# Patient Record
Sex: Female | Born: 1955 | Race: Black or African American | Hispanic: No | Marital: Married | Smoking: Former smoker
Health system: Southern US, Community
[De-identification: ages and names within clinical notes are randomized; demographics above are authoritative.]

## PROBLEM LIST (undated history)

## (undated) DIAGNOSIS — I10 Essential (primary) hypertension: Secondary | ICD-10-CM

## (undated) DIAGNOSIS — F419 Anxiety disorder, unspecified: Secondary | ICD-10-CM

## (undated) DIAGNOSIS — R011 Cardiac murmur, unspecified: Secondary | ICD-10-CM

## (undated) DIAGNOSIS — F32A Depression, unspecified: Secondary | ICD-10-CM

## (undated) DIAGNOSIS — K649 Unspecified hemorrhoids: Secondary | ICD-10-CM

## (undated) DIAGNOSIS — E785 Hyperlipidemia, unspecified: Secondary | ICD-10-CM

## (undated) DIAGNOSIS — N952 Postmenopausal atrophic vaginitis: Secondary | ICD-10-CM

## (undated) DIAGNOSIS — F329 Major depressive disorder, single episode, unspecified: Secondary | ICD-10-CM

## (undated) DIAGNOSIS — R569 Unspecified convulsions: Secondary | ICD-10-CM

## (undated) DIAGNOSIS — T7840XA Allergy, unspecified, initial encounter: Secondary | ICD-10-CM

## (undated) DIAGNOSIS — R51 Headache: Secondary | ICD-10-CM

## (undated) DIAGNOSIS — G709 Myoneural disorder, unspecified: Secondary | ICD-10-CM

## (undated) HISTORY — DX: Allergy, unspecified, initial encounter: T78.40XA

## (undated) HISTORY — DX: Essential (primary) hypertension: I10

## (undated) HISTORY — DX: Cardiac murmur, unspecified: R01.1

## (undated) HISTORY — PX: FINGER ARTHROSCOPY: SHX5001

## (undated) HISTORY — DX: Postmenopausal atrophic vaginitis: N95.2

## (undated) HISTORY — DX: Unspecified convulsions: R56.9

## (undated) HISTORY — PX: TUBAL LIGATION: SHX77

## (undated) HISTORY — DX: Unspecified hemorrhoids: K64.9

---

## 1998-04-12 ENCOUNTER — Ambulatory Visit (HOSPITAL_COMMUNITY): Admission: RE | Admit: 1998-04-12 | Discharge: 1998-04-12 | Payer: Self-pay | Admitting: Family Medicine

## 1999-09-15 ENCOUNTER — Ambulatory Visit (HOSPITAL_COMMUNITY): Admission: RE | Admit: 1999-09-15 | Discharge: 1999-09-15 | Payer: Self-pay | Admitting: Hematology and Oncology

## 1999-09-15 ENCOUNTER — Encounter: Admission: RE | Admit: 1999-09-15 | Discharge: 1999-09-15 | Payer: Self-pay | Admitting: Internal Medicine

## 1999-09-18 ENCOUNTER — Encounter: Admission: RE | Admit: 1999-09-18 | Discharge: 1999-09-18 | Payer: Self-pay | Admitting: Internal Medicine

## 1999-10-19 ENCOUNTER — Ambulatory Visit (HOSPITAL_COMMUNITY): Admission: RE | Admit: 1999-10-19 | Discharge: 1999-10-19 | Payer: Self-pay | Admitting: *Deleted

## 1999-10-27 ENCOUNTER — Encounter: Admission: RE | Admit: 1999-10-27 | Discharge: 1999-10-27 | Payer: Self-pay | Admitting: Internal Medicine

## 2000-01-05 ENCOUNTER — Encounter: Admission: RE | Admit: 2000-01-05 | Discharge: 2000-01-05 | Payer: Self-pay | Admitting: Internal Medicine

## 2000-06-10 ENCOUNTER — Encounter: Payer: Self-pay | Admitting: Emergency Medicine

## 2000-06-10 ENCOUNTER — Emergency Department (HOSPITAL_COMMUNITY): Admission: EM | Admit: 2000-06-10 | Discharge: 2000-06-10 | Payer: Self-pay | Admitting: Emergency Medicine

## 2001-01-10 ENCOUNTER — Encounter: Payer: Self-pay | Admitting: Emergency Medicine

## 2001-01-10 ENCOUNTER — Emergency Department (HOSPITAL_COMMUNITY): Admission: EM | Admit: 2001-01-10 | Discharge: 2001-01-10 | Payer: Self-pay | Admitting: Emergency Medicine

## 2001-08-18 ENCOUNTER — Emergency Department (HOSPITAL_COMMUNITY): Admission: EM | Admit: 2001-08-18 | Discharge: 2001-08-18 | Payer: Self-pay | Admitting: Emergency Medicine

## 2001-08-18 ENCOUNTER — Encounter: Payer: Self-pay | Admitting: Emergency Medicine

## 2003-03-07 ENCOUNTER — Encounter: Payer: Self-pay | Admitting: *Deleted

## 2003-03-07 ENCOUNTER — Emergency Department (HOSPITAL_COMMUNITY): Admission: EM | Admit: 2003-03-07 | Discharge: 2003-03-07 | Payer: Self-pay | Admitting: *Deleted

## 2003-10-18 ENCOUNTER — Emergency Department (HOSPITAL_COMMUNITY): Admission: EM | Admit: 2003-10-18 | Discharge: 2003-10-18 | Payer: Self-pay | Admitting: Emergency Medicine

## 2005-03-30 ENCOUNTER — Encounter (HOSPITAL_COMMUNITY): Admission: RE | Admit: 2005-03-30 | Discharge: 2005-06-27 | Payer: Self-pay | Admitting: Internal Medicine

## 2005-04-17 ENCOUNTER — Ambulatory Visit: Payer: Self-pay | Admitting: Gastroenterology

## 2005-05-07 ENCOUNTER — Ambulatory Visit: Payer: Self-pay | Admitting: Gastroenterology

## 2005-05-07 HISTORY — PX: COLONOSCOPY: SHX174

## 2005-06-14 ENCOUNTER — Emergency Department (HOSPITAL_COMMUNITY): Admission: EM | Admit: 2005-06-14 | Discharge: 2005-06-14 | Payer: Self-pay | Admitting: *Deleted

## 2007-02-05 ENCOUNTER — Ambulatory Visit (HOSPITAL_COMMUNITY): Admission: RE | Admit: 2007-02-05 | Discharge: 2007-02-05 | Payer: Self-pay | Admitting: Internal Medicine

## 2007-06-27 ENCOUNTER — Emergency Department (HOSPITAL_COMMUNITY): Admission: EM | Admit: 2007-06-27 | Discharge: 2007-06-27 | Payer: Self-pay | Admitting: Emergency Medicine

## 2008-01-05 ENCOUNTER — Emergency Department (HOSPITAL_COMMUNITY): Admission: EM | Admit: 2008-01-05 | Discharge: 2008-01-05 | Payer: Self-pay | Admitting: Emergency Medicine

## 2008-02-10 ENCOUNTER — Ambulatory Visit (HOSPITAL_COMMUNITY): Admission: RE | Admit: 2008-02-10 | Discharge: 2008-02-10 | Payer: Self-pay | Admitting: Internal Medicine

## 2008-05-14 ENCOUNTER — Encounter: Admission: RE | Admit: 2008-05-14 | Discharge: 2008-05-14 | Payer: Self-pay | Admitting: Internal Medicine

## 2009-06-01 ENCOUNTER — Ambulatory Visit (HOSPITAL_COMMUNITY): Admission: RE | Admit: 2009-06-01 | Discharge: 2009-06-01 | Payer: Self-pay | Admitting: Internal Medicine

## 2010-10-17 ENCOUNTER — Ambulatory Visit (HOSPITAL_COMMUNITY)
Admission: RE | Admit: 2010-10-17 | Discharge: 2010-10-17 | Payer: Self-pay | Source: Home / Self Care | Attending: Internal Medicine | Admitting: Internal Medicine

## 2010-10-21 ENCOUNTER — Encounter: Payer: Self-pay | Admitting: Internal Medicine

## 2011-05-25 ENCOUNTER — Emergency Department (HOSPITAL_COMMUNITY): Payer: BC Managed Care – PPO

## 2011-05-25 ENCOUNTER — Inpatient Hospital Stay (INDEPENDENT_AMBULATORY_CARE_PROVIDER_SITE_OTHER)
Admission: RE | Admit: 2011-05-25 | Discharge: 2011-05-25 | Disposition: A | Discharge status: Home / Self Care | Payer: BC Managed Care – PPO | Source: Ambulatory Visit | Attending: Emergency Medicine | Admitting: Emergency Medicine

## 2011-05-25 ENCOUNTER — Emergency Department (HOSPITAL_COMMUNITY)
Admission: EM | Admit: 2011-05-25 | Discharge: 2011-05-25 | Disposition: A | Discharge status: Home / Self Care | Payer: BC Managed Care – PPO | Attending: Emergency Medicine | Admitting: Emergency Medicine

## 2011-05-25 DIAGNOSIS — R0789 Other chest pain: Secondary | ICD-10-CM | POA: Insufficient documentation

## 2011-05-25 DIAGNOSIS — E119 Type 2 diabetes mellitus without complications: Secondary | ICD-10-CM | POA: Insufficient documentation

## 2011-05-25 DIAGNOSIS — I10 Essential (primary) hypertension: Secondary | ICD-10-CM | POA: Insufficient documentation

## 2011-05-25 DIAGNOSIS — R51 Headache: Secondary | ICD-10-CM | POA: Insufficient documentation

## 2011-05-25 DIAGNOSIS — R569 Unspecified convulsions: Secondary | ICD-10-CM | POA: Insufficient documentation

## 2011-05-25 DIAGNOSIS — R079 Chest pain, unspecified: Secondary | ICD-10-CM

## 2011-05-25 LAB — POCT I-STAT, CHEM 8
BUN: 15 mg/dL (ref 6–23)
Chloride: 103 mEq/L (ref 96–112)
Sodium: 142 mEq/L (ref 135–145)

## 2011-05-25 LAB — CK TOTAL AND CKMB (NOT AT ARMC)
CK, MB: 4.8 ng/mL — ABNORMAL HIGH (ref 0.3–4.0)
Relative Index: 2 (ref 0.0–2.5)

## 2011-05-25 LAB — CBC
HCT: 39.8 % (ref 36.0–46.0)
Hemoglobin: 13.9 g/dL (ref 12.0–15.0)
MCV: 78.3 fL (ref 78.0–100.0)
RBC: 5.08 MIL/uL (ref 3.87–5.11)
WBC: 8.4 10*3/uL (ref 4.0–10.5)

## 2011-05-25 LAB — BASIC METABOLIC PANEL
CO2: 29 mEq/L (ref 19–32)
GFR calc non Af Amer: >60 mL/min (ref >60)
Glucose, Bld: 114 mg/dL — ABNORMAL HIGH (ref 70–99)
Potassium: 3.9 mEq/L (ref 3.5–5.1)
Sodium: 138 mEq/L (ref 135–145)

## 2011-05-25 LAB — URINALYSIS, ROUTINE W REFLEX MICROSCOPIC
Bilirubin Urine: NEGATIVE
Nitrite: NEGATIVE
Specific Gravity, Urine: 1.009 (ref 1.005–1.030)
Urobilinogen, UA: 0.2 mg/dL (ref 0.0–1.0)

## 2011-05-25 LAB — DIFFERENTIAL
Lymphocytes Relative: 29 % (ref 12–46)
Lymphs Abs: 2.5 10*3/uL (ref 0.7–4.0)
Neutrophils Relative %: 58 % (ref 43–77)

## 2011-05-25 LAB — GLUCOSE, CAPILLARY: Glucose-Capillary: 103 mg/dL — ABNORMAL HIGH (ref 70–99)

## 2011-05-25 LAB — URINE MICROSCOPIC-ADD ON

## 2011-10-29 ENCOUNTER — Other Ambulatory Visit (HOSPITAL_COMMUNITY): Payer: Self-pay | Admitting: Internal Medicine

## 2011-10-29 DIAGNOSIS — Z1231 Encounter for screening mammogram for malignant neoplasm of breast: Secondary | ICD-10-CM

## 2011-11-27 ENCOUNTER — Ambulatory Visit (HOSPITAL_COMMUNITY)
Admission: RE | Admit: 2011-11-27 | Discharge: 2011-11-27 | Disposition: A | Discharge status: Home / Self Care | Payer: BC Managed Care – PPO | Source: Ambulatory Visit | Attending: Internal Medicine | Admitting: Internal Medicine

## 2011-11-27 DIAGNOSIS — Z1231 Encounter for screening mammogram for malignant neoplasm of breast: Secondary | ICD-10-CM | POA: Insufficient documentation

## 2011-12-20 ENCOUNTER — Encounter (INDEPENDENT_AMBULATORY_CARE_PROVIDER_SITE_OTHER): Payer: Self-pay | Admitting: Surgery

## 2011-12-25 ENCOUNTER — Encounter (INDEPENDENT_AMBULATORY_CARE_PROVIDER_SITE_OTHER): Payer: Self-pay | Admitting: Surgery

## 2011-12-26 ENCOUNTER — Encounter (INDEPENDENT_AMBULATORY_CARE_PROVIDER_SITE_OTHER): Payer: Self-pay | Admitting: Surgery

## 2012-01-09 ENCOUNTER — Ambulatory Visit (INDEPENDENT_AMBULATORY_CARE_PROVIDER_SITE_OTHER): Payer: Self-pay | Admitting: Surgery

## 2012-08-06 ENCOUNTER — Encounter (INDEPENDENT_AMBULATORY_CARE_PROVIDER_SITE_OTHER): Payer: Self-pay | Admitting: Surgery

## 2012-08-25 ENCOUNTER — Ambulatory Visit (INDEPENDENT_AMBULATORY_CARE_PROVIDER_SITE_OTHER): Payer: BC Managed Care – PPO | Admitting: Surgery

## 2012-08-25 ENCOUNTER — Encounter (INDEPENDENT_AMBULATORY_CARE_PROVIDER_SITE_OTHER): Payer: Self-pay | Admitting: Surgery

## 2012-08-25 VITALS — BP 146/82 | HR 64 | Temp 97.1°F | Resp 20 | Ht 62.0 in | Wt 170.6 lb

## 2012-08-25 DIAGNOSIS — K644 Residual hemorrhoidal skin tags: Secondary | ICD-10-CM | POA: Insufficient documentation

## 2012-08-25 NOTE — Progress Notes (Signed)
Patient ID: Heidi Lewis, female   DOB: 13-Mar-1956, 56 y.o.   MRN: [id number]  Chief Complaint  Patient presents with  . New Evaluation    eval internal hems    HPI Heidi Lewis is a 56 y.o. female.   HPI  This is a very pleasant lady referred by Dr.Avbuere for evaluation of bleeding hemorrhoids. She has been having bouts from a 2 year period she's been treated with medications including steroid suppositories without improvement. She has minimal pain. She has no issues with constipation or issues with continence. She has had no previous surgery on her hemorrhoids. She is otherwise without complaints Past Medical History  Diagnosis Date  . Diabetes mellitus   . Hemorrhoid   . Hypertension   . Vaginitis, atrophic   . Heart murmur     Past Surgical History  Procedure Date  . Cesarean section     x3    Family History  Problem Relation Age of Onset  . Heart disease Mother   . Cancer Father     Social History History  Substance Use Topics  . Smoking status: Former Smoker    Quit date: 12/25/1983  . Smokeless tobacco: Not on file  . Alcohol Use: No    Allergies  Allergen Reactions  . Codeine Nausea Only    Current Outpatient Prescriptions  Medication Sig Dispense Refill  . losartan (COZAAR) 50 MG tablet Take 50 mg by mouth daily.      . metFORMIN (GLUCOPHAGE) 500 MG tablet Take 500 mg by mouth daily with breakfast.        Review of Systems Review of Systems  Constitutional: Negative for fever, chills and unexpected weight change.  HENT: Negative for hearing loss, congestion, sore throat, trouble swallowing and voice change.   Eyes: Negative for visual disturbance.  Respiratory: Negative for cough and wheezing.   Cardiovascular: Negative for chest pain, palpitations and leg swelling.  Gastrointestinal: Positive for anal bleeding. Negative for nausea, vomiting, abdominal pain, diarrhea, constipation, blood in stool and abdominal distention.  Genitourinary: Negative  for hematuria, vaginal bleeding and difficulty urinating.  Musculoskeletal: Negative for arthralgias.  Skin: Negative for rash and wound.  Neurological: Negative for seizures, syncope and headaches.  Hematological: Negative for adenopathy. Does not bruise/bleed easily.  Psychiatric/Behavioral: Negative for confusion.    Blood pressure 146/82, pulse 64, temperature 97.1 F (36.2 C), temperature source Temporal, resp. rate 20, height 5\' 2"  (1.575 m), weight 170 lb 9.6 oz (77.384 kg).  Physical Exam Physical Exam  Constitutional: She is oriented to person, place, and time. She appears well-developed and well-nourished. No distress.  HENT:  Head: Normocephalic and atraumatic.  Right Ear: External ear normal.  Left Ear: External ear normal.  Nose: Nose normal.  Mouth/Throat: Oropharynx is clear and moist. No oropharyngeal exudate.  Eyes: Conjunctivae normal are normal. Pupils are equal, round, and reactive to light. Right eye exhibits no discharge. Left eye exhibits no discharge. No scleral icterus.  Neck: Normal range of motion. Neck supple. No tracheal deviation present. No thyromegaly present.  Cardiovascular: Normal rate, regular rhythm, normal heart sounds and intact distal pulses.   No murmur heard. Pulmonary/Chest: Effort normal and breath sounds normal. No respiratory distress. She has no wheezes. She has no rales.  Abdominal: Soft. Bowel sounds are normal. She exhibits no distension. There is no tenderness. There is no rebound.  Genitourinary:       She has large external hemorrhoids including one that is excoriated and bleeds easily.  It is soft.  Musculoskeletal: Normal range of motion. She exhibits no edema and no tenderness.  Lymphadenopathy:    She has no cervical adenopathy.  Neurological: She is alert and oriented to person, place, and time.  Skin: Skin is warm and dry. No rash noted. She is not diaphoretic. No erythema.  Psychiatric: Her behavior is normal. Judgment  normal.    Data Reviewed   Assessment    Bleeding external hemorrhoid    Plan    Exam under anesthesia and excision is recommended. I believe pathologic evaluation is needed for this large hemorrhoid to rule out malignancy as it is quite friable and excoriated. I discussed this with her in detail. I discussed the risks of surgery which includes but is not limited to bleeding, infection, incontinence, recurrence, need for further surgery, et Karie Soda. She understands and wishes to proceed. Likelihood of success is good       [name] A 08/25/2012, 4:52 PM

## 2012-09-10 ENCOUNTER — Encounter (HOSPITAL_COMMUNITY): Payer: Self-pay

## 2012-09-10 ENCOUNTER — Encounter (HOSPITAL_COMMUNITY)
Admission: RE | Admit: 2012-09-10 | Discharge: 2012-09-10 | Disposition: A | Discharge status: Home / Self Care | Payer: BC Managed Care – PPO | Source: Ambulatory Visit | Attending: Anesthesiology | Admitting: Anesthesiology

## 2012-09-10 ENCOUNTER — Encounter (HOSPITAL_COMMUNITY)
Admission: RE | Admit: 2012-09-10 | Discharge: 2012-09-10 | Disposition: A | Discharge status: Home / Self Care | Payer: BC Managed Care – PPO | Source: Ambulatory Visit | Attending: Surgery | Admitting: Surgery

## 2012-09-10 HISTORY — DX: Headache: R51

## 2012-09-10 LAB — CBC
MCH: 25.8 pg — ABNORMAL LOW (ref 26.0–34.0)
MCHC: 32.7 g/dL (ref 30.0–36.0)
Platelets: 318 10*3/uL (ref 150–400)
RBC: 4.88 MIL/uL (ref 3.87–5.11)

## 2012-09-10 LAB — BASIC METABOLIC PANEL
Calcium: 11.2 mg/dL — ABNORMAL HIGH (ref 8.4–10.5)
GFR calc non Af Amer: >90 mL/min (ref >90)
Glucose, Bld: 108 mg/dL — ABNORMAL HIGH (ref 70–99)
Sodium: 144 mEq/L (ref 135–145)

## 2012-09-10 LAB — SURGICAL PCR SCREEN: MRSA, PCR: NEGATIVE

## 2012-09-10 NOTE — Pre-Procedure Instructions (Signed)
20 BLASA RAISCH  09/10/2012   Your procedure is scheduled on:  Friday, December 20th  Report to [facility] at 0530 AM.  Call this number if you have problems the morning of surgery: [phone]   Remember:   Do not eat food or drink:After Midnight.   Take these medicines the morning of surgery with A SIP OF WATER: none   Do not wear jewelry, make-up or nail polish.  Do not wear lotions, powders, or perfumes  Do not shave 48 hours prior to surgery. Men may shave face and neck.  Do not bring valuables to the hospital.  Contacts, dentures or bridgework may not be worn into surgery.  Leave suitcase in the car. After surgery it may be brought to your room.  For patients admitted to the hospital, checkout time is 11:00 AM the day of discharge.   Patients discharged the day of surgery will not be allowed to drive home.    Special Instructions: Shower using CHG 2 nights before surgery and the night before surgery.  If you shower the day of surgery use CHG.  Use special wash - you have one bottle of CHG for all showers.  You should use approximately 1/3 of the bottle for each shower.   Please read over the following fact sheets that you were given: Pain Booklet, Coughing and Deep Breathing and Surgical Site Infection Prevention

## 2012-09-12 ENCOUNTER — Other Ambulatory Visit (HOSPITAL_COMMUNITY): Payer: BC Managed Care – PPO

## 2012-09-18 MED ORDER — CEFAZOLIN SODIUM-DEXTROSE 2-3 GM-% IV SOLR
2.0000 g | INTRAVENOUS | Status: AC
  Administered 2012-09-19: 2 g via INTRAVENOUS
  Filled 2012-09-18 (×2): qty 50

## 2012-09-18 NOTE — H&P (Signed)
Patient ID: Heidi Lewis, female DOB: 1956/02/17, 56 y.o. MRN: [id number]  Chief Complaint   Patient presents with   .  New Evaluation     eval internal hems    HPI  Heidi Lewis is a 56 y.o. female.  HPI  This is a very pleasant lady referred by Dr.Avbuere for evaluation of bleeding hemorrhoids. She has been having bouts from a 2 year period she's been treated with medications including steroid suppositories without improvement. She has minimal pain. She has no issues with constipation or issues with continence. She has had no previous surgery on her hemorrhoids. She is otherwise without complaints  Past Medical History   Diagnosis  Date   .  Diabetes mellitus    .  Hemorrhoid    .  Hypertension    .  Vaginitis, atrophic    .  Heart murmur     Past Surgical History   Procedure  Date   .  Cesarean section      x3    Family History   Problem  Relation  Age of Onset   .  Heart disease  Mother    .  Cancer  Father     Social History  History   Substance Use Topics   .  Smoking status:  Former Smoker     Quit date:  12/25/1983   .  Smokeless tobacco:  Not on file   .  Alcohol Use:  No    Allergies   Allergen  Reactions   .  Codeine  Nausea Only    Current Outpatient Prescriptions   Medication  Sig  Dispense  Refill   .  losartan (COZAAR) 50 MG tablet  Take 50 mg by mouth daily.     .  metFORMIN (GLUCOPHAGE) 500 MG tablet  Take 500 mg by mouth daily with breakfast.      Review of Systems  Review of Systems  Constitutional: Negative for fever, chills and unexpected weight change.  HENT: Negative for hearing loss, congestion, sore throat, trouble swallowing and voice change.  Eyes: Negative for visual disturbance.  Respiratory: Negative for cough and wheezing.  Cardiovascular: Negative for chest pain, palpitations and leg swelling.  Gastrointestinal: Positive for anal bleeding. Negative for nausea, vomiting, abdominal pain, diarrhea, constipation, blood in stool and  abdominal distention.  Genitourinary: Negative for hematuria, vaginal bleeding and difficulty urinating.  Musculoskeletal: Negative for arthralgias.  Skin: Negative for rash and wound.  Neurological: Negative for seizures, syncope and headaches.  Hematological: Negative for adenopathy. Does not bruise/bleed easily.  Psychiatric/Behavioral: Negative for confusion.   Blood pressure 146/82, pulse 64, temperature 97.1 F (36.2 C), temperature source Temporal, resp. rate 20, height 5\' 2"  (1.575 m), weight 170 lb 9.6 oz (77.384 kg).  Physical Exam  Physical Exam  Constitutional: She is oriented to person, place, and time. She appears well-developed and well-nourished. No distress.  HENT:  Head: Normocephalic and atraumatic.  Right Ear: External ear normal.  Left Ear: External ear normal.  Nose: Nose normal.  Mouth/Throat: Oropharynx is clear and moist. No oropharyngeal exudate.  Eyes: Conjunctivae normal are normal. Pupils are equal, round, and reactive to light. Right eye exhibits no discharge. Left eye exhibits no discharge. No scleral icterus.  Neck: Normal range of motion. Neck supple. No tracheal deviation present. No thyromegaly present.  Cardiovascular: Normal rate, regular rhythm, normal heart sounds and intact distal pulses.  No murmur heard.  Pulmonary/Chest: Effort normal and breath sounds normal.  No respiratory distress. She has no wheezes. She has no rales.  Abdominal: Soft. Bowel sounds are normal. She exhibits no distension. There is no tenderness. There is no rebound.  Genitourinary:  She has large external hemorrhoids including one that is excoriated and bleeds easily. It is soft.  Musculoskeletal: Normal range of motion. She exhibits no edema and no tenderness.  Lymphadenopathy:  She has no cervical adenopathy.  Neurological: She is alert and oriented to person, place, and time.  Skin: Skin is warm and dry. No rash noted. She is not diaphoretic. No erythema.  Psychiatric:  Her behavior is normal. Judgment normal.   Data Reviewed  Assessment   Bleeding external hemorrhoid   Plan   Exam under anesthesia and excision is recommended. I believe pathologic evaluation is needed for this large hemorrhoid to rule out malignancy as it is quite friable and excoriated. I discussed this with her in detail. I discussed the risks of surgery which includes but is not limited to bleeding, infection, incontinence, recurrence, need for further surgery, et Karie Soda. She understands and wishes to proceed. Likelihood of success is good   Marlon Suleiman A

## 2012-09-19 ENCOUNTER — Encounter (HOSPITAL_COMMUNITY): Payer: Self-pay | Admitting: Surgery

## 2012-09-19 ENCOUNTER — Encounter (HOSPITAL_COMMUNITY): Payer: Self-pay | Admitting: Critical Care Medicine

## 2012-09-19 ENCOUNTER — Ambulatory Visit (HOSPITAL_COMMUNITY)
Admission: RE | Admit: 2012-09-19 | Discharge: 2012-09-19 | Disposition: A | Discharge status: Home / Self Care | Payer: BC Managed Care – PPO | Source: Ambulatory Visit | Attending: Surgery | Admitting: Surgery

## 2012-09-19 ENCOUNTER — Encounter (HOSPITAL_COMMUNITY)
Admission: RE | Disposition: A | Discharge status: Home / Self Care | Payer: Self-pay | Source: Ambulatory Visit | Attending: Surgery

## 2012-09-19 ENCOUNTER — Ambulatory Visit (HOSPITAL_COMMUNITY): Payer: BC Managed Care – PPO | Admitting: Critical Care Medicine

## 2012-09-19 DIAGNOSIS — I10 Essential (primary) hypertension: Secondary | ICD-10-CM | POA: Insufficient documentation

## 2012-09-19 DIAGNOSIS — R011 Cardiac murmur, unspecified: Secondary | ICD-10-CM | POA: Insufficient documentation

## 2012-09-19 DIAGNOSIS — K648 Other hemorrhoids: Secondary | ICD-10-CM | POA: Insufficient documentation

## 2012-09-19 DIAGNOSIS — Z885 Allergy status to narcotic agent status: Secondary | ICD-10-CM | POA: Insufficient documentation

## 2012-09-19 DIAGNOSIS — E119 Type 2 diabetes mellitus without complications: Secondary | ICD-10-CM | POA: Insufficient documentation

## 2012-09-19 DIAGNOSIS — Z8249 Family history of ischemic heart disease and other diseases of the circulatory system: Secondary | ICD-10-CM | POA: Insufficient documentation

## 2012-09-19 DIAGNOSIS — K644 Residual hemorrhoidal skin tags: Secondary | ICD-10-CM

## 2012-09-19 DIAGNOSIS — Z809 Family history of malignant neoplasm, unspecified: Secondary | ICD-10-CM | POA: Insufficient documentation

## 2012-09-19 HISTORY — PX: EVALUATION UNDER ANESTHESIA WITH HEMORRHOIDECTOMY: SHX5624

## 2012-09-19 LAB — GLUCOSE, CAPILLARY: Glucose-Capillary: 111 mg/dL — ABNORMAL HIGH (ref 70–99)

## 2012-09-19 SURGERY — EXAM UNDER ANESTHESIA WITH HEMORRHOIDECTOMY
Anesthesia: General | Site: Rectum | Wound class: Clean Contaminated

## 2012-09-19 MED ORDER — BUPIVACAINE LIPOSOME 1.3 % IJ SUSP
20.0000 mL | Freq: Once | INTRAMUSCULAR | Status: DC
  Filled 2012-09-19: qty 20

## 2012-09-19 MED ORDER — BUPIVACAINE-EPINEPHRINE (PF) 0.5% -1:200000 IJ SOLN
INTRAMUSCULAR | Status: AC
  Filled 2012-09-19: qty 10

## 2012-09-19 MED ORDER — BUPIVACAINE LIPOSOME 1.3 % IJ SUSP
INTRAMUSCULAR | Status: DC | PRN
  Administered 2012-09-19: 20 mL

## 2012-09-19 MED ORDER — 0.9 % SODIUM CHLORIDE (POUR BTL) OPTIME
TOPICAL | Status: DC | PRN
  Administered 2012-09-19: 1000 mL

## 2012-09-19 MED ORDER — HEMOSTATIC AGENTS (NO CHARGE) OPTIME
TOPICAL | Status: DC | PRN
  Administered 2012-09-19: 1 via TOPICAL

## 2012-09-19 MED ORDER — KETOROLAC TROMETHAMINE 30 MG/ML IJ SOLN
INTRAMUSCULAR | Status: DC | PRN
  Administered 2012-09-19: 30 mg via INTRAVENOUS

## 2012-09-19 MED ORDER — HYDROCODONE-ACETAMINOPHEN 5-325 MG PO TABS: 1.0000 | ORAL_TABLET | ORAL | Status: DC | PRN

## 2012-09-19 MED ORDER — FENTANYL CITRATE 0.05 MG/ML IJ SOLN: 25.0000 ug | INTRAMUSCULAR | Status: DC | PRN

## 2012-09-19 MED ORDER — ONDANSETRON HCL 4 MG/2ML IJ SOLN
INTRAMUSCULAR | Status: DC | PRN
  Administered 2012-09-19: 4 mg via INTRAVENOUS

## 2012-09-19 MED ORDER — LIDOCAINE HCL (CARDIAC) 20 MG/ML IV SOLN
INTRAVENOUS | Status: DC | PRN
  Administered 2012-09-19: 100 mg via INTRAVENOUS

## 2012-09-19 MED ORDER — DIBUCAINE 1 % RE OINT
TOPICAL_OINTMENT | RECTAL | Status: AC
  Filled 2012-09-19: qty 28

## 2012-09-19 MED ORDER — PROPOFOL 10 MG/ML IV BOLUS
INTRAVENOUS | Status: DC | PRN
  Administered 2012-09-19: 200 mg via INTRAVENOUS
  Administered 2012-09-19: 50 mg via INTRAVENOUS

## 2012-09-19 MED ORDER — LIDOCAINE 5 % EX OINT: TOPICAL_OINTMENT | CUTANEOUS | Status: DC | PRN

## 2012-09-19 MED ORDER — LACTATED RINGERS IV SOLN
INTRAVENOUS | Status: DC | PRN
  Administered 2012-09-19: 07:00:00 via INTRAVENOUS

## 2012-09-19 MED ORDER — FENTANYL CITRATE 0.05 MG/ML IJ SOLN
INTRAMUSCULAR | Status: DC | PRN
  Administered 2012-09-19 (×3): 25 ug via INTRAVENOUS

## 2012-09-19 MED ORDER — MIDAZOLAM HCL 5 MG/5ML IJ SOLN
INTRAMUSCULAR | Status: DC | PRN
  Administered 2012-09-19: 2 mg via INTRAVENOUS

## 2012-09-19 SURGICAL SUPPLY — 35 items
BLADE SURG 15 STRL LF DISP TIS (BLADE) ×1 IMPLANT
BLADE SURG 15 STRL SS (BLADE) ×2
CANISTER SUCTION 2500CC (MISCELLANEOUS) ×2 IMPLANT
CLOTH BEACON ORANGE TIMEOUT ST (SAFETY) ×2 IMPLANT
COVER SURGICAL LIGHT HANDLE (MISCELLANEOUS) ×2 IMPLANT
DECANTER SPIKE VIAL GLASS SM (MISCELLANEOUS) IMPLANT
DRAPE PROXIMA HALF (DRAPES) ×2 IMPLANT
ELECT CAUTERY BLADE 6.4 (BLADE) ×1 IMPLANT
ELECT REM PT RETURN 9FT ADLT (ELECTROSURGICAL) ×2
ELECTRODE REM PT RTRN 9FT ADLT (ELECTROSURGICAL) ×1 IMPLANT
GAUZE SPONGE 4X4 16PLY XRAY LF (GAUZE/BANDAGES/DRESSINGS) ×2 IMPLANT
GLOVE ECLIPSE 6.5 STRL STRAW (GLOVE) ×1 IMPLANT
GLOVE SURG SIGNA 7.5 PF LTX (GLOVE) ×2 IMPLANT
GLOVE SURG SS PI 7.0 STRL IVOR (GLOVE) ×1 IMPLANT
GOWN PREVENTION PLUS XLARGE (GOWN DISPOSABLE) ×2 IMPLANT
GOWN STRL NON-REIN LRG LVL3 (GOWN DISPOSABLE) ×2 IMPLANT
KIT BASIN OR (CUSTOM PROCEDURE TRAY) ×2 IMPLANT
KIT ROOM TURNOVER OR (KITS) ×2 IMPLANT
NDL HYPO 25GX1X1/2 BEV (NEEDLE) ×1 IMPLANT
NEEDLE HYPO 25GX1X1/2 BEV (NEEDLE) ×2 IMPLANT
NS IRRIG 1000ML POUR BTL (IV SOLUTION) ×2 IMPLANT
PACK LITHOTOMY IV (CUSTOM PROCEDURE TRAY) ×2 IMPLANT
PAD ARMBOARD 7.5X6 YLW CONV (MISCELLANEOUS) ×2 IMPLANT
PENCIL BUTTON HOLSTER BLD 10FT (ELECTRODE) ×1 IMPLANT
SPONGE GAUZE 4X4 12PLY (GAUZE/BANDAGES/DRESSINGS) ×2 IMPLANT
SPONGE SURGIFOAM ABS GEL 100 (HEMOSTASIS) ×2 IMPLANT
SURGILUBE 2OZ TUBE FLIPTOP (MISCELLANEOUS) ×2 IMPLANT
SUT CHROMIC 2 0 SH (SUTURE) ×2 IMPLANT
SYR BULB 3OZ (MISCELLANEOUS) ×2 IMPLANT
SYR CONTROL 10ML LL (SYRINGE) ×2 IMPLANT
TOWEL OR 17X24 6PK STRL BLUE (TOWEL DISPOSABLE) ×2 IMPLANT
TOWEL OR 17X26 10 PK STRL BLUE (TOWEL DISPOSABLE) ×2 IMPLANT
TUBE CONNECTING 12X1/4 (SUCTIONS) ×2 IMPLANT
UNDERPAD 30X30 INCONTINENT (UNDERPADS AND DIAPERS) ×2 IMPLANT
YANKAUER SUCT BULB TIP NO VENT (SUCTIONS) ×2 IMPLANT

## 2012-09-19 NOTE — Anesthesia Preprocedure Evaluation (Addendum)
Anesthesia Evaluation  Patient identified by MRN, date of birth, ID band Patient awake    Reviewed: Allergy & Precautions, H&P , NPO status , Patient's Chart, lab work & pertinent test results  Airway Mallampati: II      Dental  (+) Dental Advisory Given   Pulmonary former smoker,  breath sounds clear to auscultation        Cardiovascular hypertension, + Peripheral Vascular Disease Rhythm:Regular Rate:Normal     Neuro/Psych  Headaches,    GI/Hepatic   Endo/Other  diabetes, Oral Hypoglycemic Agents  Renal/GU      Musculoskeletal   Abdominal   Peds  Hematology   Anesthesia Other Findings   Reproductive/Obstetrics                         Anesthesia Physical Anesthesia Plan  ASA: II  Anesthesia Plan: General   Post-op Pain Management:    Induction: Intravenous  Airway Management Planned: LMA  Additional Equipment:   Intra-op Plan:   Post-operative Plan: Extubation in OR  Informed Consent: I have reviewed the patients History and Physical, chart, labs and discussed the procedure including the risks, benefits and alternatives for the proposed anesthesia with the patient or authorized representative who has indicated his/her understanding and acceptance.   Dental advisory given  Plan Discussed with: Anesthesiologist and Surgeon  Anesthesia Plan Comments:         Anesthesia Quick Evaluation

## 2012-09-19 NOTE — Interval H&P Note (Signed)
History and Physical Interval Note: no change in H and P  09/19/2012 6:55 AM  Heidi Lewis  has presented today for surgery, with the diagnosis of bleeding external hemmorrhoids  The various methods of treatment have been discussed with the patient and family. After consideration of risks, benefits and other options for treatment, the patient has consented to  Procedure(s) (LRB) with comments: EXAM UNDER ANESTHESIA WITH HEMORRHOIDECTOMY (N/Lewis) - exam under anesthesia hemorrhiodectomy 1 -2 columns as Lewis surgical intervention .  The patient's history has been reviewed, patient examined, no change in status, stable for surgery.  I have reviewed the patient's chart and labs.  Questions were answered to the patient's satisfaction.     Heidi Lewis

## 2012-09-19 NOTE — Preoperative (Signed)
Beta Blockers   Reason not to administer Beta Blockers:Not Applicable 

## 2012-09-19 NOTE — Op Note (Signed)
EXAM UNDER ANESTHESIA WITH HEMORRHOIDECTOMY  Procedure Note  Heidi Lewis 09/19/2012   Pre-op Diagnosis: bleeding external and internal hemmorrhoids     Post-op Diagnosis: same  Procedure(s): EXAM UNDER ANESTHESIA WITH HEMORRHOIDECTOMY (2 column internal and external)  Surgeon(s): Shelly Rubenstein, MD  Anesthesia: General  Staff:  Circulator Scrub Person  Estimated Blood Loss: Minimal               Specimens: sent to path         Procedure: The patient was brought to the operating room and identified the correct patient. She was placed supine on the operating room table and general anesthesia was induced. The patient was then placed in the lithotomy position. Her perianal area was then prepped and draped in the usual sterile fashion. I injected Experel around the perianal area. The patient had Lewis large excoriated external and internal hemorrhoid at the 7:00 position in the lithotomy position. After inserting Lewis retractor into the anal canal she also had Lewis large hemorrhoidal column at the 5:00 position. I excised both these areas with the harmonic scalpel and sent in separately to pathology for evaluation. Again, the hemorrhoid at the 7:00 position was the excoriated hemorrhoid.  No other intra-anal pathology was identified. I achieved hemostasis with the harmonic scalpel. I injected further Experel around the perianal area. I then placed Lewis piece of Gelfoam into the anal canal. Hemostasis appeared to be achieved. All the counts were correct at the end of the procedure. The patient was then extubated in the operating room and taken in Lewis stable condition to the recovery room. Heidi Lewis   Date: 09/19/2012  Time: 7:48 AM

## 2012-09-19 NOTE — Transfer of Care (Signed)
Immediate Anesthesia Transfer of Care Note  Patient: Heidi Lewis  Procedure(s) Performed: Procedure(s) (LRB) with comments: EXAM UNDER ANESTHESIA WITH HEMORRHOIDECTOMY (N/A) - exam under anesthesia hemorrhiodectomy 1 -2 columns  Patient Location: PACU  Anesthesia Type:General  Level of Consciousness: awake, alert  and oriented  Airway & Oxygen Therapy: Patient Spontanous Breathing and Patient connected to nasal cannula oxygen  Post-op Assessment: Report given to PACU RN, Post -op Vital signs reviewed and stable and Patient moving all extremities X 4  Post vital signs: Reviewed and stable  Complications: No apparent anesthesia complications

## 2012-09-19 NOTE — Anesthesia Postprocedure Evaluation (Signed)
  Anesthesia Post-op Note  Patient: Heidi Lewis  Procedure(s) Performed: Procedure(s) (LRB) with comments: EXAM UNDER ANESTHESIA WITH HEMORRHOIDECTOMY (N/A) - exam under anesthesia hemorrhiodectomy 1 -2 columns  Patient Location: PACU  Anesthesia Type:General  Level of Consciousness: awake  Airway and Oxygen Therapy: Patient Spontanous Breathing  Post-op Pain: mild  Post-op Assessment: Post-op Vital signs reviewed  Post-op Vital Signs: Reviewed  Complications: No apparent anesthesia complications

## 2012-09-22 ENCOUNTER — Encounter (HOSPITAL_COMMUNITY): Payer: Self-pay | Admitting: Surgery

## 2012-10-01 ENCOUNTER — Emergency Department (HOSPITAL_COMMUNITY): Payer: BC Managed Care – PPO

## 2012-10-01 ENCOUNTER — Encounter (HOSPITAL_COMMUNITY): Payer: Self-pay | Admitting: *Deleted

## 2012-10-01 ENCOUNTER — Emergency Department (HOSPITAL_COMMUNITY)
Admission: EM | Admit: 2012-10-01 | Discharge: 2012-10-01 | Disposition: A | Discharge status: Home / Self Care | Payer: BC Managed Care – PPO | Attending: Emergency Medicine | Admitting: Emergency Medicine

## 2012-10-01 DIAGNOSIS — J3489 Other specified disorders of nose and nasal sinuses: Secondary | ICD-10-CM | POA: Insufficient documentation

## 2012-10-01 DIAGNOSIS — R5381 Other malaise: Secondary | ICD-10-CM | POA: Insufficient documentation

## 2012-10-01 DIAGNOSIS — R05 Cough: Secondary | ICD-10-CM | POA: Insufficient documentation

## 2012-10-01 DIAGNOSIS — R52 Pain, unspecified: Secondary | ICD-10-CM | POA: Insufficient documentation

## 2012-10-01 DIAGNOSIS — R011 Cardiac murmur, unspecified: Secondary | ICD-10-CM | POA: Insufficient documentation

## 2012-10-01 DIAGNOSIS — IMO0001 Reserved for inherently not codable concepts without codable children: Secondary | ICD-10-CM | POA: Insufficient documentation

## 2012-10-01 DIAGNOSIS — R059 Cough, unspecified: Secondary | ICD-10-CM | POA: Insufficient documentation

## 2012-10-01 DIAGNOSIS — I10 Essential (primary) hypertension: Secondary | ICD-10-CM | POA: Insufficient documentation

## 2012-10-01 DIAGNOSIS — E119 Type 2 diabetes mellitus without complications: Secondary | ICD-10-CM | POA: Insufficient documentation

## 2012-10-01 DIAGNOSIS — R509 Fever, unspecified: Secondary | ICD-10-CM | POA: Insufficient documentation

## 2012-10-01 DIAGNOSIS — Z8719 Personal history of other diseases of the digestive system: Secondary | ICD-10-CM | POA: Insufficient documentation

## 2012-10-01 DIAGNOSIS — R51 Headache: Secondary | ICD-10-CM | POA: Insufficient documentation

## 2012-10-01 DIAGNOSIS — Z79899 Other long term (current) drug therapy: Secondary | ICD-10-CM | POA: Insufficient documentation

## 2012-10-01 DIAGNOSIS — Z8742 Personal history of other diseases of the female genital tract: Secondary | ICD-10-CM | POA: Insufficient documentation

## 2012-10-01 DIAGNOSIS — Z87891 Personal history of nicotine dependence: Secondary | ICD-10-CM | POA: Insufficient documentation

## 2012-10-01 MED ORDER — IBUPROFEN 400 MG PO TABS
600.0000 mg | ORAL_TABLET | Freq: Once | ORAL | Status: AC
  Administered 2012-10-01: 600 mg via ORAL
  Filled 2012-10-01: qty 2

## 2012-10-01 MED ORDER — HYDROCOD POLST-CHLORPHEN POLST 10-8 MG/5ML PO LQCR: 5.0000 mL | Freq: Two times a day (BID) | ORAL | Status: DC | PRN

## 2012-10-01 MED ORDER — IBUPROFEN 600 MG PO TABS: 600.0000 mg | ORAL_TABLET | Freq: Four times a day (QID) | ORAL | Status: DC | PRN

## 2012-10-01 MED ORDER — HYDROCOD POLST-CHLORPHEN POLST 10-8 MG/5ML PO LQCR
5.0000 mL | Freq: Once | ORAL | Status: AC
  Administered 2012-10-01: 5 mL via ORAL
  Filled 2012-10-01: qty 5

## 2012-10-01 NOTE — ED Notes (Signed)
Patient presents with c/o feeling bad.  Has been having generalized body aches, headaches

## 2012-10-01 NOTE — ED Provider Notes (Signed)
History     CSN: [id number]  Arrival date & time 10/01/12  1308   None     Chief Complaint  Patient presents with  . Generalized Body Aches    (Consider location/radiation/quality/duration/timing/severity/associated sxs/prior treatment) HPI Comments: Patient presents with complaint of gradual onset fever, cough productive of green sputum, myalgias, runny nose, headache for the past 2 days. She has been taking over-the-counter cold and flu medications as well as Vicodin without relief of symptoms. Patient has Vicodin for recent hemorrhoid surgery. No sore throat, ear pain, nausea, vomiting, or diarrhea. Patient has history of diabetes, HTN. No history of heart or lung problems. Patient does not currently smoke. No wheezing or shortness of breath. Course is constant. Nothing makes symptoms better or worse. Patient has family members recently had the flu and pneumonia.  The history is provided by the patient.    Past Medical History  Diagnosis Date  . Diabetes mellitus   . Hemorrhoid   . Vaginitis, atrophic   . Heart murmur   . Headache   . Hypertension     dr  Cecil Cranker    Past Surgical History  Procedure Date  . Cesarean section     x3  . Finger arthroscopy   . Evaluation under anesthesia with hemorrhoidectomy 09/19/2012    Procedure: EXAM UNDER ANESTHESIA WITH HEMORRHOIDECTOMY;  Surgeon: Shelly Rubenstein, MD;  Location: [facility] OR;  Service: General;  Laterality: N/A;  exam under anesthesia hemorrhiodectomy 1 -2 columns    Family History  Problem Relation Age of Onset  . Heart disease Mother   . Cancer Father     History  Substance Use Topics  . Smoking status: Former Smoker    Quit date: 12/25/1983  . Smokeless tobacco: Not on file  . Alcohol Use: No    OB History    Grav Para Term Preterm Abortions TAB SAB Ect Mult Living                  Review of Systems  Constitutional: Positive for fever, chills, activity change and fatigue. Negative for appetite change.   HENT: Positive for rhinorrhea. Negative for ear pain, congestion, sore throat, neck stiffness and sinus pressure.   Eyes: Negative for redness.  Respiratory: Positive for cough. Negative for shortness of breath and wheezing.   Gastrointestinal: Negative for nausea, vomiting, abdominal pain and diarrhea.  Genitourinary: Negative for dysuria.  Musculoskeletal: Negative for myalgias.  Skin: Negative for rash.  Neurological: Negative for headaches.  Hematological: Negative for adenopathy.    Allergies  Codeine  Home Medications   Current Outpatient Rx  Name  Route  Sig  Dispense  Refill  . HYDROCODONE-ACETAMINOPHEN 5-325 MG PO TABS   Oral   Take 1-2 tablets by mouth every 4 (four) hours as needed for pain.   30 tablet   1   . LIDOCAINE 5 % EX OINT   Topical   Apply topically as needed.   35.44 g   0   . LOSARTAN POTASSIUM 50 MG PO TABS   Oral   Take 50 mg by mouth daily.         Marland Kitchen METFORMIN HCL 500 MG PO TABS   Oral   Take 500 mg by mouth daily with breakfast.           BP 147/95  Pulse 102  Temp 102.8 F (39.3 C) (Oral)  Resp 20  SpO2 97%  Physical Exam  Nursing note and vitals reviewed. Constitutional: She  appears well-developed and well-nourished.  HENT:  Head: Normocephalic and atraumatic.  Right Ear: Tympanic membrane, external ear and ear canal normal.  Left Ear: Tympanic membrane, external ear and ear canal normal.  Nose: Rhinorrhea present. No mucosal edema.  Mouth/Throat: Uvula is midline and oropharynx is clear and moist. Mucous membranes are not dry.  Eyes: Conjunctivae normal are normal. Right eye exhibits no discharge. Left eye exhibits no discharge.  Neck: Normal range of motion. Neck supple.  Cardiovascular: Normal rate, regular rhythm and normal heart sounds.   Pulmonary/Chest: Effort normal and breath sounds normal.       Coughing during exam  Abdominal: Soft. There is no tenderness.  Neurological: She is alert.  Skin: Skin is warm and  dry.  Psychiatric: She has a normal mood and affect.    ED Course  Procedures (including critical care time)  Labs Reviewed - No data to display Dg Chest 2 View  10/01/2012  *RADIOLOGY REPORT*  Clinical Data: Headache.  Fever, cough for 2 days.  History of heart murmur.  History of diabetes.  CHEST - 2 VIEW  Comparison: 09/10/2012  Findings: Heart size is normal. There is mild prominence of interstitial markings.  There are no focal consolidations or pleural effusions.  IMPRESSION: No evidence for acute cardiopulmonary abnormality.   Original Report Authenticated By: Norva Pavlov, M.D.      1. Influenza-like illness     6:39 AM Patient seen and examined. Work-up initiated. Medications ordered.   Vital signs reviewed and are as follows: Filed Vitals:   10/01/12 0616  BP: 147/95  Pulse: 102  Temp: 102.8 F (39.3 C)  Resp: 20   8:05 AM X-ray reviewed by myself. Radiologist report reviewed. No acute process.   Patient discharged to home. Encouraged to rest and drink plenty of fluids.  Patient told to return to ED or see their primary doctor if their symptoms worsen, high fever not controlled with tylenol, persistent vomiting, they feel they are dehydrated, or if they have any other concerns.  Patient verbalized understanding and agreed with plan.    BP 130/78  Pulse 77  Temp 98.9 F (37.2 C) (Oral)  Resp 18  SpO2 94%    MDM  Patient with symptoms consistent with influenza.  Vitals are stable, low-grade fever.  No signs of dehydration, tolerating PO's.  Lungs are clear, CXR neg.  No meningismus. Supportive therapy indicated with return if symptoms worsen.  Patient counseled.        [place], [place] 10/01/12 [phone]

## 2012-10-01 NOTE — ED Provider Notes (Signed)
Medical screening examination/treatment/procedure(s) were performed by non-physician practitioner and as supervising physician I was immediately available for consultation/collaboration.  Olivia Mackie, MD 10/01/12 2224

## 2012-10-06 ENCOUNTER — Encounter (INDEPENDENT_AMBULATORY_CARE_PROVIDER_SITE_OTHER): Payer: Self-pay | Admitting: Surgery

## 2012-10-06 ENCOUNTER — Ambulatory Visit (INDEPENDENT_AMBULATORY_CARE_PROVIDER_SITE_OTHER): Payer: BC Managed Care – PPO | Admitting: Surgery

## 2012-10-06 VITALS — BP 134/72 | HR 84 | Temp 97.3°F | Resp 20 | Ht 62.0 in | Wt 172.2 lb

## 2012-10-06 DIAGNOSIS — Z09 Encounter for follow-up examination after completed treatment for conditions other than malignant neoplasm: Secondary | ICD-10-CM

## 2012-10-06 NOTE — Progress Notes (Signed)
Subjective:     Patient ID: Heidi Lewis, female   DOB: [birth year], 57 y.o.   MRN: [id number]  HPI She is here for her first postop visit status post 2 column hemorrhoidectomy. She is fairly comfortable. She is still on a stool softener. She has minimal drainage.  Review of Systems     Objective:   Physical Exam On exam, there are 2 open wounds with excellent granulation tissue. There is no evidence of infection. The pathology demonstrated benign hemorrhoidal tissue and a benign polyp    Assessment:     Patient stable postop    Plan:     She will continue sitz baths and stool softeners. I will see her back in one month

## 2012-11-06 ENCOUNTER — Encounter (INDEPENDENT_AMBULATORY_CARE_PROVIDER_SITE_OTHER): Payer: Self-pay | Admitting: Surgery

## 2012-11-06 ENCOUNTER — Ambulatory Visit (INDEPENDENT_AMBULATORY_CARE_PROVIDER_SITE_OTHER): Payer: BC Managed Care – PPO | Admitting: Surgery

## 2012-11-06 VITALS — BP 127/85 | HR 82 | Temp 97.8°F | Resp 16 | Ht 62.0 in | Wt 171.4 lb

## 2012-11-06 DIAGNOSIS — Z09 Encounter for follow-up examination after completed treatment for conditions other than malignant neoplasm: Secondary | ICD-10-CM

## 2012-11-06 NOTE — Progress Notes (Signed)
Subjective:     Patient ID: Heidi Lewis, female   DOB: [birth year], 57 y.o.   MRN: [id number]  HPI  She is here for another followup visit. Other than constipation, she is doing much better. She has minimal drainage from the surgical sites. Review of Systems     Objective:   Physical Exam On exam, the wound as it was closed. There is only some small amount of exposed granulation tissue.    Assessment:     Patient doing well postop    Plan:     Again, I encouraged her to try MiraLAX for constipation. She will come back and see me as needed

## 2013-03-12 ENCOUNTER — Other Ambulatory Visit (HOSPITAL_COMMUNITY): Payer: Self-pay | Admitting: Internal Medicine

## 2013-03-12 DIAGNOSIS — Z1231 Encounter for screening mammogram for malignant neoplasm of breast: Secondary | ICD-10-CM

## 2013-03-13 ENCOUNTER — Ambulatory Visit (HOSPITAL_COMMUNITY): Payer: BC Managed Care – PPO

## 2013-03-19 ENCOUNTER — Ambulatory Visit (HOSPITAL_COMMUNITY): Payer: BC Managed Care – PPO

## 2013-05-05 ENCOUNTER — Encounter: Payer: Self-pay | Admitting: Gastroenterology

## 2013-06-04 ENCOUNTER — Ambulatory Visit (HOSPITAL_COMMUNITY)
Admission: RE | Admit: 2013-06-04 | Discharge: 2013-06-04 | Disposition: A | Discharge status: Home / Self Care | Payer: BC Managed Care – PPO | Source: Ambulatory Visit | Attending: Internal Medicine | Admitting: Internal Medicine

## 2013-06-04 DIAGNOSIS — Z1231 Encounter for screening mammogram for malignant neoplasm of breast: Secondary | ICD-10-CM

## 2013-08-28 IMAGING — CR DG CHEST 2V
2 series · 2 of 2 positions shown · non-contrast
Comparison: 09/10/2012

CLINICAL DATA: Headache.  Fever, cough for 2 days.  History of
heart murmur.  History of diabetes.

CHEST - 2 VIEW

[w chest pa]
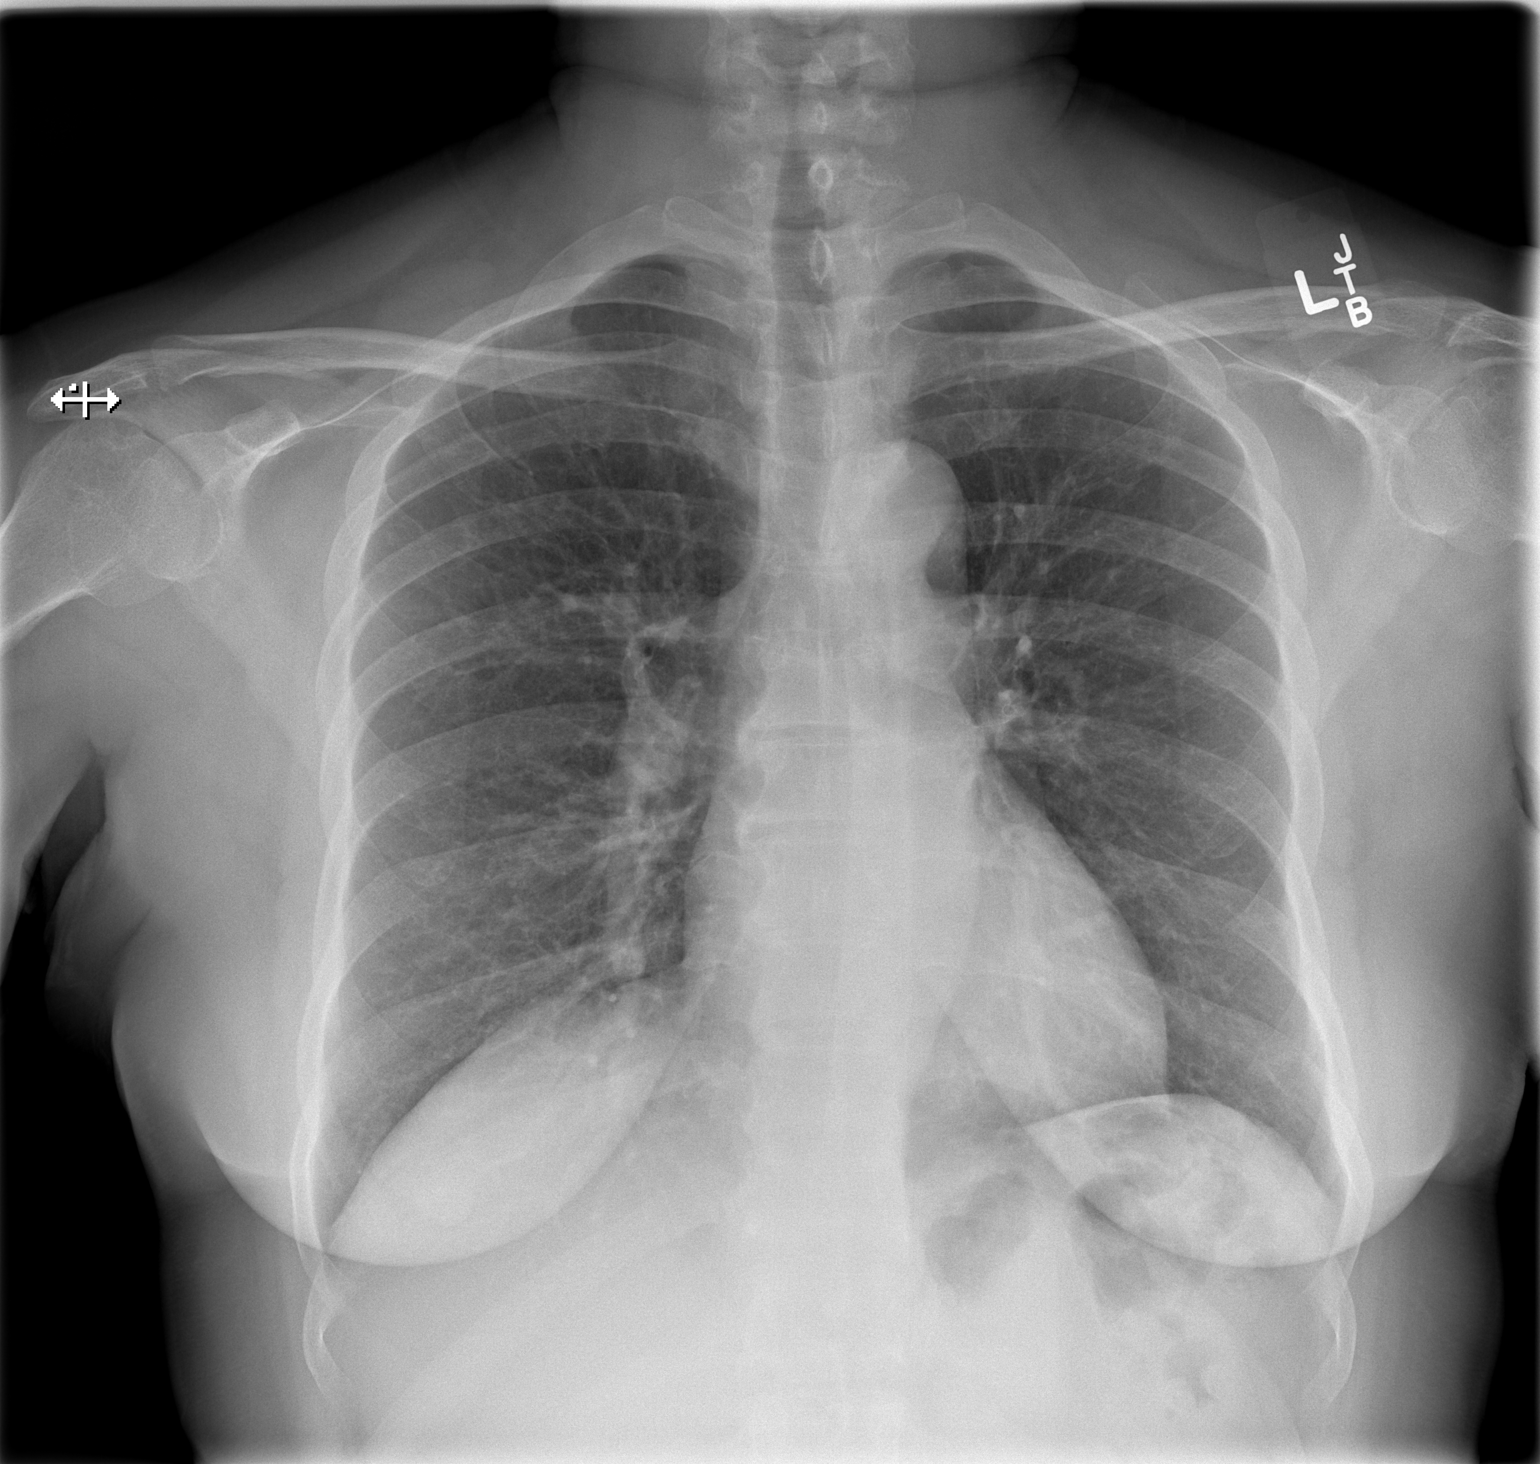

[w chest lat]
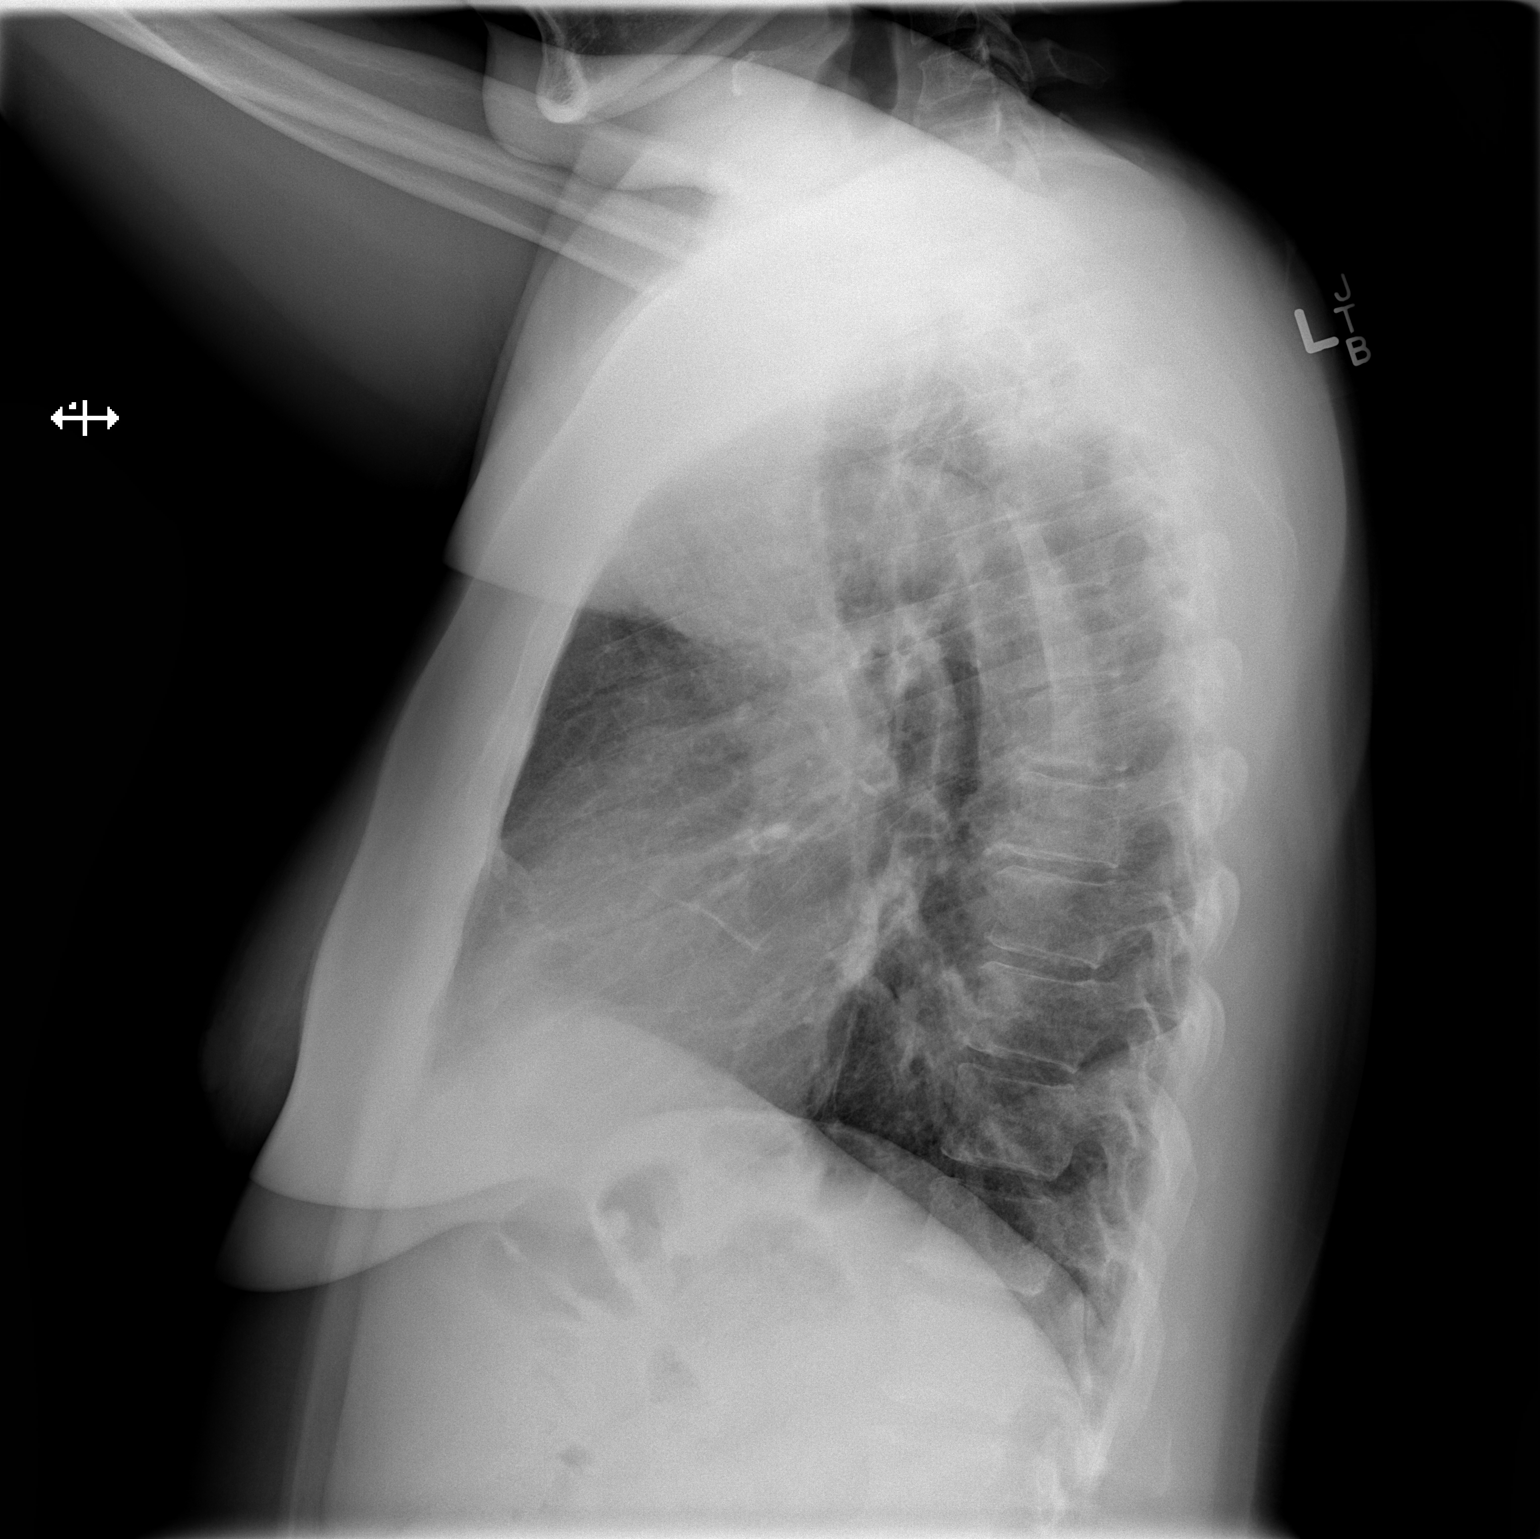

[2 of 2 positions shown; findings below may reference images not displayed]

FINDINGS: Heart size is normal. There is mild prominence of
interstitial markings.  There are no focal consolidations or
pleural effusions.
IMPRESSION: No evidence for acute cardiopulmonary abnormality.

## 2013-10-28 ENCOUNTER — Encounter (HOSPITAL_COMMUNITY): Payer: Self-pay | Admitting: Emergency Medicine

## 2013-10-28 ENCOUNTER — Emergency Department (HOSPITAL_COMMUNITY)
Admission: EM | Admit: 2013-10-28 | Discharge: 2013-10-28 | Disposition: A | Discharge status: Home / Self Care | Payer: BC Managed Care – PPO | Source: Home / Self Care

## 2013-10-28 DIAGNOSIS — R0982 Postnasal drip: Secondary | ICD-10-CM

## 2013-10-28 DIAGNOSIS — J111 Influenza due to unidentified influenza virus with other respiratory manifestations: Secondary | ICD-10-CM

## 2013-10-28 DIAGNOSIS — R059 Cough, unspecified: Secondary | ICD-10-CM

## 2013-10-28 DIAGNOSIS — R05 Cough: Secondary | ICD-10-CM

## 2013-10-28 MED ORDER — KETOROLAC TROMETHAMINE 30 MG/ML IJ SOLN
INTRAMUSCULAR | Status: AC
  Filled 2013-10-28: qty 1

## 2013-10-28 MED ORDER — KETOROLAC TROMETHAMINE 30 MG/ML IJ SOLN
30.0000 mg | Freq: Once | INTRAMUSCULAR | Status: AC
  Administered 2013-10-28: 30 mg via INTRAMUSCULAR

## 2013-10-28 MED ORDER — HYDROCOD POLST-CHLORPHEN POLST 10-8 MG/5ML PO LQCR: 5.0000 mL | Freq: Two times a day (BID) | ORAL | Status: DC | PRN

## 2013-10-28 MED ORDER — ONDANSETRON HCL 4 MG PO TABS: 4.0000 mg | ORAL_TABLET | Freq: Four times a day (QID) | ORAL | Status: DC

## 2013-10-28 MED ORDER — OSELTAMIVIR PHOSPHATE 75 MG PO CAPS: 75.0000 mg | ORAL_CAPSULE | Freq: Two times a day (BID) | ORAL | Status: DC

## 2013-10-28 NOTE — ED Notes (Signed)
Pt  Reports      Symptoms    Of     Body  Aches  Cough   Congested  As well  As  stuffyness             Pt     Symptoms  Started  2  Days  Ago       Pt  Appears  Ion no  Severe  Distress   Speaking in  Complete  sentances

## 2013-10-28 NOTE — ED Provider Notes (Signed)
Medical screening examination/treatment/procedure(s) were performed by a resident physician or non-physician practitioner and as the supervising physician I was immediately available for consultation/collaboration.  Lynne Leader, MD   Gregor Hams, MD 10/28/13 [phone]

## 2013-10-28 NOTE — Discharge Instructions (Signed)
Fever, Adult A fever is a higher than normal body temperature. In an adult, an oral temperature around 98.6 F (37 C) is considered normal. A temperature of 100.4 F (38 C) or higher is generally considered a fever. Mild or moderate fevers generally have no long-term effects and often do not require treatment. Extreme fever (greater than or equal to 106 F or 41.1 C) can cause seizures. The sweating that may occur with repeated or prolonged fever may cause dehydration. Elderly people can develop confusion during a fever. A measured temperature can vary with:  Age.  Time of day.  Method of measurement (mouth, underarm, rectal, or ear). The fever is confirmed by taking a temperature with a thermometer. Temperatures can be taken different ways. Some methods are accurate and some are not.  An oral temperature is used most commonly. Electronic thermometers are fast and accurate.  An ear temperature will only be accurate if the thermometer is positioned as recommended by the manufacturer.  A rectal temperature is accurate and done for those adults who have a condition where an oral temperature cannot be taken.  An underarm (axillary) temperature is not accurate and not recommended. Fever is a symptom, not a disease.  CAUSES   Infections commonly cause fever.  Some noninfectious causes for fever include:  Some arthritis conditions.  Some thyroid or adrenal gland conditions.  Some immune system conditions.  Some types of cancer.  A medicine reaction.  High doses of certain street drugs such as methamphetamine.  Dehydration.  Exposure to high outside or room temperatures.  Occasionally, the source of a fever cannot be determined. This is sometimes called a "fever of unknown origin" (FUO).  Some situations may lead to a temporary rise in body temperature that may go away on its own. Examples are:  Childbirth.  Surgery.  Intense exercise. HOME CARE INSTRUCTIONS   Take  appropriate medicines for fever. Follow dosing instructions carefully. If you use acetaminophen to reduce the fever, be careful to avoid taking other medicines that also contain acetaminophen. Do not take aspirin for a fever if you are younger than age 59. There is an association with Reye's syndrome. Reye's syndrome is a rare but potentially deadly disease.  If an infection is present and antibiotics have been prescribed, take them as directed. Finish them even if you start to feel better.  Rest as needed.  Maintain an adequate fluid intake. To prevent dehydration during an illness with prolonged or recurrent fever, you may need to drink extra fluid.Drink enough fluids to keep your urine clear or pale yellow.  Sponging or bathing with room temperature water may help reduce body temperature. Do not use ice water or alcohol sponge baths.  Dress comfortably, but do not over-bundle. SEEK MEDICAL CARE IF:   You are unable to keep fluids down.  You develop vomiting or diarrhea.  You are not feeling at least partly better after 3 days.  You develop new symptoms or problems. SEEK IMMEDIATE MEDICAL CARE IF:   You have shortness of breath or trouble breathing.  You develop excessive weakness.  You are dizzy or you faint.  You are extremely thirsty or you are making little or no urine.  You develop new pain that was not there before (such as in the head, neck, chest, back, or abdomen).  You have persistant vomiting and diarrhea for more than 1 to 2 days.  You develop a stiff neck or your eyes become sensitive to light.  You develop a  skin rash.  You have a fever or persistent symptoms for more than 2 to 3 days.  You have a fever and your symptoms suddenly get worse. MAKE SURE YOU:   Understand these instructions.  Will watch your condition.  Will get help right away if you are not doing well or get worse. Document Released: 03/13/2001 Document Revised: 12/10/2011 Document  Reviewed: 07/19/2011 [facility] Patient Information 2014 [place], [place].  Influenza, Adult Influenza ("the flu") is a viral infection of the respiratory tract. It occurs more often in winter months because people spend more time in close contact with one another. Influenza can make you feel very sick. Influenza easily spreads from person to person (contagious). CAUSES  Influenza is caused by a virus that infects the respiratory tract. You can catch the virus by breathing in droplets from an infected person's cough or sneeze. You can also catch the virus by touching something that was recently contaminated with the virus and then touching your mouth, nose, or eyes. SYMPTOMS  Symptoms typically last 4 to 10 days and may include:  Fever.  Chills.  Headache, body aches, and muscle aches.  Sore throat.  Chest discomfort and cough.  Poor appetite.  Weakness or feeling tired.  Dizziness.  Nausea or vomiting. DIAGNOSIS  Diagnosis of influenza is often made based on your history and a physical exam. A nose or throat swab test can be done to confirm the diagnosis. RISKS AND COMPLICATIONS You may be at risk for a more severe case of influenza if you smoke cigarettes, have diabetes, have chronic heart disease (such as heart failure) or lung disease (such as asthma), or if you have a weakened immune system. Elderly people and pregnant women are also at risk for more serious infections. The most common complication of influenza is a lung infection (pneumonia). Sometimes, this complication can require emergency medical care and may be life-threatening. PREVENTION  An annual influenza vaccination (flu shot) is the best way to avoid getting influenza. An annual flu shot is now routinely recommended for all adults in the [place] TREATMENT  In mild cases, influenza goes away on its own. Treatment is directed at relieving symptoms. For more severe cases, your caregiver may prescribe antiviral medicines to  shorten the sickness. Antibiotic medicines are not effective, because the infection is caused by a virus, not by bacteria. HOME CARE INSTRUCTIONS  Only take over-the-counter or prescription medicines for pain, discomfort, or fever as directed by your caregiver.  Use a cool mist humidifier to make breathing easier.  Get plenty of rest until your temperature returns to normal. This usually takes 3 to 4 days.  Drink enough fluids to keep your urine clear or pale yellow.  Cover your mouth and nose when coughing or sneezing, and wash your hands well to avoid spreading the virus.  Stay home from work or school until your fever has been gone for at least 1 full day. SEEK MEDICAL CARE IF:   You have chest pain or a deep cough that worsens or produces more mucus.  You have nausea, vomiting, or diarrhea. SEEK IMMEDIATE MEDICAL CARE IF:   You have difficulty breathing, shortness of breath, or your skin or nails turn bluish.  You have severe neck pain or stiffness.  You have a severe headache, facial pain, or earache.  You have a worsening or recurring fever.  You have nausea or vomiting that cannot be controlled. MAKE SURE YOU:  Understand these instructions.  Will watch your condition.  Will  get help right away if you are not doing well or get worse. Document Released: 09/14/2000 Document Revised: 03/18/2012 Document Reviewed: 12/17/2011 [facility] Patient Information 2014 [place], [place].  Upper Respiratory Infection, Adult An upper respiratory infection (URI) is also sometimes known as the common cold. The upper respiratory tract includes the nose, sinuses, throat, trachea, and bronchi. Bronchi are the airways leading to the lungs. Most people improve within 1 week, but symptoms can last up to 2 weeks. A residual cough may last even longer.  CAUSES Many different viruses can infect the tissues lining the upper respiratory tract. The tissues become irritated and inflamed and often  become very moist. Mucus production is also common. A cold is contagious. You can easily spread the virus to others by oral contact. This includes kissing, sharing a glass, coughing, or sneezing. Touching your mouth or nose and then touching a surface, which is then touched by another person, can also spread the virus. SYMPTOMS  Symptoms typically develop 1 to 3 days after you come in contact with a cold virus. Symptoms vary from person to person. They may include:  Runny nose.  Sneezing.  Nasal congestion.  Sinus irritation.  Sore throat.  Loss of voice (laryngitis).  Cough.  Fatigue.  Muscle aches.  Loss of appetite.  Headache.  Low-grade fever. DIAGNOSIS  You might diagnose your own cold based on familiar symptoms, since most people get a cold 2 to 3 times a year. Your caregiver can confirm this based on your exam. Most importantly, your caregiver can check that your symptoms are not due to another disease such as strep throat, sinusitis, pneumonia, asthma, or epiglottitis. Blood tests, throat tests, and X-rays are not necessary to diagnose a common cold, but they may sometimes be helpful in excluding other more serious diseases. Your caregiver will decide if any further tests are required. RISKS AND COMPLICATIONS  You may be at risk for a more severe case of the common cold if you smoke cigarettes, have chronic heart disease (such as heart failure) or lung disease (such as asthma), or if you have a weakened immune system. The very young and very old are also at risk for more serious infections. Bacterial sinusitis, middle ear infections, and bacterial pneumonia can complicate the common cold. The common cold can worsen asthma and chronic obstructive pulmonary disease (COPD). Sometimes, these complications can require emergency medical care and may be life-threatening. PREVENTION  The best way to protect against getting a cold is to practice good hygiene. Avoid oral or hand contact  with people with cold symptoms. Wash your hands often if contact occurs. There is no clear evidence that vitamin C, vitamin E, echinacea, or exercise reduces the chance of developing a cold. However, it is always recommended to get plenty of rest and practice good nutrition. TREATMENT  Treatment is directed at relieving symptoms. There is no cure. Antibiotics are not effective, because the infection is caused by a virus, not by bacteria. Treatment may include:  Increased fluid intake. Sports drinks offer valuable electrolytes, sugars, and fluids.  Breathing heated mist or steam (vaporizer or shower).  Eating chicken soup or other clear broths, and maintaining good nutrition.  Getting plenty of rest.  Using gargles or lozenges for comfort.  Controlling fevers with ibuprofen or acetaminophen as directed by your caregiver.  Increasing usage of your inhaler if you have asthma. Zinc gel and zinc lozenges, taken in the first 24 hours of the common cold, can shorten the duration and lessen  the severity of symptoms. Pain medicines may help with fever, muscle aches, and throat pain. A variety of non-prescription medicines are available to treat congestion and runny nose. Your caregiver can make recommendations and may suggest nasal or lung inhalers for other symptoms.  HOME CARE INSTRUCTIONS   Only take over-the-counter or prescription medicines for pain, discomfort, or fever as directed by your caregiver.  Use a warm mist humidifier or inhale steam from a shower to increase air moisture. This may keep secretions moist and make it easier to breathe.  Drink enough water and fluids to keep your urine clear or pale yellow.  Rest as needed.  Return to work when your temperature has returned to normal or as your caregiver advises. You may need to stay home longer to avoid infecting others. You can also use a face mask and careful hand washing to prevent spread of the virus. SEEK MEDICAL CARE IF:    After the first few days, you feel you are getting worse rather than better.  You need your caregiver's advice about medicines to control symptoms.  You develop chills, worsening shortness of breath, or brown or red sputum. These may be signs of pneumonia.  You develop yellow or brown nasal discharge or pain in the face, especially when you bend forward. These may be signs of sinusitis.  You develop a fever, swollen neck glands, pain with swallowing, or white areas in the back of your throat. These may be signs of strep throat. SEEK IMMEDIATE MEDICAL CARE IF:   You have a fever.  You develop severe or persistent headache, ear pain, sinus pain, or chest pain.  You develop wheezing, a prolonged cough, cough up blood, or have a change in your usual mucus (if you have chronic lung disease).  You develop sore muscles or a stiff neck. Document Released: 03/13/2001 Document Revised: 12/10/2011 Document Reviewed: 01/19/2011 [facility] Patient Information 2014 [place], [place].

## 2013-10-28 NOTE — ED Provider Notes (Signed)
CSN: [id number]     Arrival date & time 10/28/13  1305 History   First MD Initiated Contact with Patient 10/28/13 1435     Chief Complaint  Patient presents with  . URI   (Consider location/radiation/quality/duration/timing/severity/associated sxs/prior Treatment) HPI Comments: 58 year old female states that she suddenly developed symptoms this morning consisting of generalized body aches, vomiting and diarrhea, headache, fever, mild runny nose and scratchy throat. She did not have a flu shot this year stating that 15 years ago it made her sicker than she is now.   Past Medical History  Diagnosis Date  . Diabetes mellitus   . Hemorrhoid   . Vaginitis, atrophic   . Heart murmur   . Headache(784.0)   . Hypertension     dr  Revonda Humphrey   Past Surgical History  Procedure Laterality Date  . Cesarean section      x3  . Finger arthroscopy    . Evaluation under anesthesia with hemorrhoidectomy  09/19/2012    Procedure: EXAM UNDER ANESTHESIA WITH HEMORRHOIDECTOMY;  Surgeon: Harl Bowie, MD;  Location: [facility];  Service: General;  Laterality: N/A;  exam under anesthesia hemorrhiodectomy 1 -2 columns   Family History  Problem Relation Age of Onset  . Heart disease Mother   . Cancer Father    History  Substance Use Topics  . Smoking status: Former Smoker    Quit date: 12/25/1983  . Smokeless tobacco: Not on file  . Alcohol Use: No   OB History   Grav Para Term Preterm Abortions TAB SAB Ect Mult Living                 Review of Systems  Constitutional: Positive for fever, activity change, appetite change and fatigue. Negative for chills.  HENT: Positive for congestion, postnasal drip and rhinorrhea. Negative for facial swelling and sore throat.   Eyes: Negative.   Respiratory: Positive for cough. Negative for shortness of breath.   Cardiovascular: Negative.   Gastrointestinal: Positive for nausea, vomiting and diarrhea.  Genitourinary: Negative.   Musculoskeletal: Negative  for neck pain and neck stiffness.  Skin: Negative for pallor and rash.  Neurological: Negative.     Allergies  Codeine  Home Medications   Current Outpatient Rx  Name  Route  Sig  Dispense  Refill  . chlorpheniramine-HYDROcodone (TUSSIONEX PENNKINETIC ER) 10-8 MG/5ML LQCR   Oral   Take 5 mLs by mouth every 12 (twelve) hours as needed for cough.   60 mL   0   . ibuprofen (ADVIL,MOTRIN) 600 MG tablet   Oral   Take 1 tablet (600 mg total) by mouth every 6 (six) hours as needed for pain.   20 tablet   0   . losartan (COZAAR) 50 MG tablet   Oral   Take 50 mg by mouth daily.         . metFORMIN (GLUCOPHAGE) 500 MG tablet   Oral   Take 500 mg by mouth daily with breakfast.         . ondansetron (ZOFRAN) 4 MG tablet   Oral   Take 1 tablet (4 mg total) by mouth every 6 (six) hours.   12 tablet   0   . oseltamivir (TAMIFLU) 75 MG capsule   Oral   Take 1 capsule (75 mg total) by mouth every 12 (twelve) hours.   10 capsule   0    BP 144/99  Pulse 90  Temp(Src) 100.1 F (37.8 C) (Oral)  Resp 21  SpO2 96% Physical Exam  Nursing note and vitals reviewed. Constitutional: She is oriented to person, place, and time. She appears well-developed and well-nourished. No distress.  HENT:  Head: Normocephalic.  Right Ear: External ear normal.  Left Ear: External ear normal.  Minor erythema of the posterior oropharynx. More and streaky type fashion. No exudates or edema. Copious amount of clear PND  Eyes: Conjunctivae and EOM are normal.  Neck: Normal range of motion. Neck supple.  Cardiovascular: Normal rate and regular rhythm.   Pulmonary/Chest: Effort normal. No respiratory distress. She has no rales.  Musculoskeletal: Normal range of motion.  Lymphadenopathy:    She has no cervical adenopathy.  Neurological: She is alert and oriented to person, place, and time. No cranial nerve deficit.  Skin: Skin is warm and dry.  Psychiatric: She has a normal mood and affect.     ED Course  Procedures (including critical care time) Labs Review Labs Reviewed - No data to display Imaging Review No results found.    MDM   1. Influenza   2. PND (post-nasal drip)   3. Cough      As per requested hydrocodone-containing medication, Tussionex 1 teaspoon every 12 hours when necessary 60 cc. Acetaminophen 650 mg every 4 hours when necessary fever aches and pains. Drink plenty of fluids in small frequent doses. Recommend taking Zofran every 6-8 hours as needed for nausea vomiting Tamiflu for 5 days as directed  Janne Napoleon, NP 10/28/13 [phone]

## 2014-04-27 ENCOUNTER — Emergency Department (HOSPITAL_COMMUNITY)
Admission: EM | Admit: 2014-04-27 | Discharge: 2014-04-27 | Disposition: A | Discharge status: Home / Self Care | Payer: BC Managed Care – PPO | Attending: Emergency Medicine | Admitting: Emergency Medicine

## 2014-04-27 ENCOUNTER — Encounter (HOSPITAL_COMMUNITY): Payer: Self-pay | Admitting: Emergency Medicine

## 2014-04-27 ENCOUNTER — Emergency Department (HOSPITAL_COMMUNITY): Payer: BC Managed Care – PPO

## 2014-04-27 DIAGNOSIS — Y93E5 Activity, floor mopping and cleaning: Secondary | ICD-10-CM | POA: Insufficient documentation

## 2014-04-27 DIAGNOSIS — S8990XA Unspecified injury of unspecified lower leg, initial encounter: Secondary | ICD-10-CM | POA: Insufficient documentation

## 2014-04-27 DIAGNOSIS — S82109A Unspecified fracture of upper end of unspecified tibia, initial encounter for closed fracture: Secondary | ICD-10-CM | POA: Insufficient documentation

## 2014-04-27 DIAGNOSIS — S99929A Unspecified injury of unspecified foot, initial encounter: Secondary | ICD-10-CM

## 2014-04-27 DIAGNOSIS — S82142A Displaced bicondylar fracture of left tibia, initial encounter for closed fracture: Secondary | ICD-10-CM

## 2014-04-27 DIAGNOSIS — I10 Essential (primary) hypertension: Secondary | ICD-10-CM | POA: Insufficient documentation

## 2014-04-27 DIAGNOSIS — Z79899 Other long term (current) drug therapy: Secondary | ICD-10-CM | POA: Insufficient documentation

## 2014-04-27 DIAGNOSIS — Z87891 Personal history of nicotine dependence: Secondary | ICD-10-CM | POA: Insufficient documentation

## 2014-04-27 DIAGNOSIS — E119 Type 2 diabetes mellitus without complications: Secondary | ICD-10-CM | POA: Insufficient documentation

## 2014-04-27 DIAGNOSIS — R011 Cardiac murmur, unspecified: Secondary | ICD-10-CM | POA: Insufficient documentation

## 2014-04-27 DIAGNOSIS — Z8742 Personal history of other diseases of the female genital tract: Secondary | ICD-10-CM | POA: Insufficient documentation

## 2014-04-27 DIAGNOSIS — Y92009 Unspecified place in unspecified non-institutional (private) residence as the place of occurrence of the external cause: Secondary | ICD-10-CM | POA: Insufficient documentation

## 2014-04-27 DIAGNOSIS — W1809XA Striking against other object with subsequent fall, initial encounter: Secondary | ICD-10-CM | POA: Insufficient documentation

## 2014-04-27 DIAGNOSIS — S99919A Unspecified injury of unspecified ankle, initial encounter: Secondary | ICD-10-CM

## 2014-04-27 MED ORDER — METHOCARBAMOL 500 MG PO TABS: 1000.0000 mg | ORAL_TABLET | Freq: Four times a day (QID) | ORAL | Status: DC

## 2014-04-27 MED ORDER — HYDROMORPHONE HCL PF 1 MG/ML IJ SOLN
1.0000 mg | Freq: Once | INTRAMUSCULAR | Status: AC
  Administered 2014-04-27: 1 mg via INTRAVENOUS
  Filled 2014-04-27: qty 1

## 2014-04-27 MED ORDER — KETOROLAC TROMETHAMINE 30 MG/ML IJ SOLN
30.0000 mg | Freq: Once | INTRAMUSCULAR | Status: AC
  Administered 2014-04-27: 30 mg via INTRAVENOUS
  Filled 2014-04-27: qty 1

## 2014-04-27 MED ORDER — OXYCODONE-ACETAMINOPHEN 5-325 MG PO TABS: 1.0000 | ORAL_TABLET | ORAL | Status: DC | PRN

## 2014-04-27 MED ORDER — ONDANSETRON HCL 4 MG/2ML IJ SOLN
4.0000 mg | Freq: Once | INTRAMUSCULAR | Status: AC
  Administered 2014-04-27: 4 mg via INTRAVENOUS
  Filled 2014-04-27: qty 2

## 2014-04-27 NOTE — Discharge Instructions (Signed)
Knee Fracture, Adult A knee fracture is a break in any of the bones of the lower part of the thigh bone, the upper part of the bones of the lower leg, or of the kneecap. When the bones no longer meet the way they are supposed to it is called a dislocation. Sometimes there can be a dislocation along with fractures. SYMPTOMS  Symptoms may include pain, swelling, inability to bend the knee, deformity of the knee, and inability to walk.  DIAGNOSIS  This problem is usually diagnosed with x-rays. Special studies are sometimes done if a fracture is suspected but cannot be seen on ordinary x-rays. If vessels around the knee are injured, special tests may be done to see what the damage is. TREATMENT   The leg is usually splinted for the first couple of days to allow for swelling. After the swelling is down a cast is put on. Sometimes a cast is put on right away with the sides of the cast cut to allow the knee to swell. If the bones are in place, this may be all that is needed.  If the bones are out of place, medications for pain are given to allow them to be put back in place. If they are seriously out of place, surgery may be needed to hold the pieces or breaks in place using wires, pins, screws or metal plates.  Generally most fractures will heal in 4 to 6 weeks. HOME CARE INSTRUCTIONS   Use your crutches as directed.  To lessen the swelling, keep the injured leg elevated while sitting or lying down.  Apply ice to the injury for 15-20 minutes, 03-04 times per day while awake for 2 days. Put the ice in a plastic bag and place a thin towel between the bag of ice and your cast.  If you have a plaster or fiberglass cast:  Do not try to scratch the skin under the cast using sharp or pointed objects.  Check the skin around the cast every day. You may put lotion on any red or sore areas.  Keep your cast dry and clean.  If you have a plaster splint:  Wear the splint as directed.  You may loosen the  elastic around the splint if your toes become numb, tingle, or turn cold or blue.  Do not put pressure on any part of your cast or splint; it may break. Rest your cast only on a pillow the first 24 hours until it is fully hardened.  Your cast or splint can be protected during bathing with a plastic bag. Do not lower the cast or splint into water.  Only take over-the-counter or prescription medicines for pain, discomfort, or fever as directed by your caregiver.  See your caregiver soon if your cast gets damaged or breaks.  It is very important to keep all follow up appointments. Not following up as directed may result in a worsening of your condition or a failure of the fracture to heal properly. SEEK IMMEDIATE MEDICAL CARE IF:  You have continued severe pain.  You have more swelling than you did before the cast was put on.  The area below the fracture becomes painful.  Your skin or toenails below the injury turn blue or gray, or feel cold or numb.  There is drainage coming from under the cast.  New, unexplained symptoms develop (drugs used in treatment may produce side effects). MAKE SURE YOU:   Understand these instructions.  Will watch your condition.  Will   get help right away if you are not doing well or get worse. Document Released: 07/31/2006 Document Revised: 12/10/2011 Document Reviewed: 09/01/2007 [facility] Patient Information 2015 [place], [place]. This information is not intended to replace advice given to you by your health care provider. Make sure you discuss any questions you have with your health care provider.  Avoid weight bearing as discussed.  Ice and elevate as much as possible to help minimize swelling.  Call Dr Ninfa Linden for an office visit tomorrow for further management and to arrange surgery for your injury.  In the interim,  Return here if you have any problems such as worse pain or swelling.

## 2014-04-27 NOTE — ED Notes (Signed)
Per EMS: Pt was standing on a chair cleaning when she fell hitting left leg. Pt denies head, neck, back pain. Pt denies LOC. Pt reports pain to left knee and left ankle. Pulses/sensation intact. Pt unable to stand. Pt given 250 mcg fentanyl. AO x 4. 146/86. ST 108. 20 RR.

## 2014-04-27 NOTE — Progress Notes (Signed)
Orthopedic Tech Progress Note Patient Details:  Heidi Lewis 04-11-1956 [id number]  Ortho Devices Type of Ortho Device: Knee Immobilizer;Crutches Ortho Device/Splint Location: LLE Ortho Device/Splint Interventions: Ordered;Application   Braulio Bosch 04/27/2014, 4:17 PM

## 2014-04-27 NOTE — ED Notes (Signed)
PA at bedside.

## 2014-04-27 NOTE — ED Provider Notes (Signed)
CSN: [id number]     Arrival date & time 04/27/14  1128 History   First MD Initiated Contact with Patient 04/27/14 1138     Chief Complaint  Patient presents with  . Fall     (Consider location/radiation/quality/duration/timing/severity/associated sxs/prior Treatment) HPI   Heidi Lewis is a 58 y.o. female who was standing on a chair cleaning in her home when she fell,  Hitting her left leg, especially her knee on the chair during the fall. She landed on her left side.  She denies head, neckand back pain and denies loc. She is a little "sore" in her left hip. She is not on blood thinners.  Her pain is constant and she is unable to bear weight since this injury.  She was given fentanyl prior to arrival by ems which briefly improved her pain.    Past Medical History  Diagnosis Date  . Diabetes mellitus   . Hemorrhoid   . Vaginitis, atrophic   . Heart murmur   . Headache(784.0)   . Hypertension     dr  Revonda Humphrey   Past Surgical History  Procedure Laterality Date  . Cesarean section      x3  . Finger arthroscopy    . Evaluation under anesthesia with hemorrhoidectomy  09/19/2012    Procedure: EXAM UNDER ANESTHESIA WITH HEMORRHOIDECTOMY;  Surgeon: Harl Bowie, MD;  Location: [facility];  Service: General;  Laterality: N/A;  exam under anesthesia hemorrhiodectomy 1 -2 columns   Family History  Problem Relation Age of Onset  . Heart disease Mother   . Cancer Father    History  Substance Use Topics  . Smoking status: Former Smoker    Quit date: 12/25/1983  . Smokeless tobacco: Not on file  . Alcohol Use: No   OB History   Grav Para Term Preterm Abortions TAB SAB Ect Mult Living                 Review of Systems  Constitutional: Negative for fever.  Gastrointestinal: Negative for nausea and vomiting.  Musculoskeletal: Positive for arthralgias. Negative for joint swelling and myalgias.  Skin: Negative for wound.  Neurological: Negative for dizziness, weakness, numbness  and headaches.      Allergies  Codeine  Home Medications   Prior to Admission medications   Medication Sig Start Date End Date Taking? Authorizing Provider  losartan (COZAAR) 50 MG tablet Take 50 mg by mouth daily.   Yes Historical Provider, MD  metFORMIN (GLUCOPHAGE) 500 MG tablet Take 500 mg by mouth daily with breakfast.   Yes Historical Provider, MD  Multiple Vitamin (MULTIVITAMIN) tablet Take 1 tablet by mouth daily.   Yes Historical Provider, MD  methocarbamol (ROBAXIN) 500 MG tablet Take 2 tablets (1,000 mg total) by mouth 4 (four) times daily. 04/27/14   Evalee Jefferson, PA-C  oxyCODONE-acetaminophen (PERCOCET/ROXICET) 5-325 MG per tablet Take 1 tablet by mouth every 4 (four) hours as needed. 04/27/14   Evalee Jefferson, PA-C   BP 119/82  Pulse 60  Temp(Src) 99 F (37.2 C) (Oral)  Resp 18  Ht 5\' 2"  (1.575 m)  Wt 171 lb (77.565 kg)  BMI 31.27 kg/m2  SpO2 96% Physical Exam  Constitutional: She appears well-developed and well-nourished.  HENT:  Head: Atraumatic.  Neck: Normal range of motion.  Cardiovascular:  Pulses equal bilaterally  Musculoskeletal: She exhibits tenderness.       Left knee: She exhibits decreased range of motion and bony tenderness. She exhibits no ecchymosis, no deformity,  no erythema, no LCL laxity and no MCL laxity. Tenderness found. Medial joint line and lateral joint line tenderness noted.       Left ankle: She exhibits no ecchymosis and no deformity. Tenderness. Lateral malleolus and medial malleolus tenderness found. No head of 5th metatarsal and no proximal fibula tenderness found.       Left foot: She exhibits tenderness and swelling. She exhibits normal capillary refill.       Feet:  Neurological: She is alert. She has normal strength. She displays normal reflexes. No sensory deficit.  Skin: Skin is warm and dry.  Psychiatric: She has a normal mood and affect.    ED Course  Procedures (including critical care time) Labs Review Labs Reviewed - No  data to display  Imaging Review Dg Hip Complete Left  04/27/2014   CLINICAL DATA:  Left hip pain after fall.  EXAM: LEFT HIP - COMPLETE 2+ VIEW  COMPARISON:  None.  FINDINGS: There is no evidence of hip fracture or dislocation. There is no evidence of arthropathy or other focal bone abnormality.  IMPRESSION: Normal left hip.   Electronically Signed   By: Sabino Dick M.D.   On: 04/27/2014 14:03   Dg Ankle Complete Left  04/27/2014   CLINICAL DATA:  Fall.  EXAM: LEFT ANKLE COMPLETE - 3+ VIEW  COMPARISON:  None.  FINDINGS: Diffuse osteopenia. No evidence of fracture or dislocation. No significant soft tissue swelling.  IMPRESSION: Diffuse mild osteopenia.  No acute abnormality identified.   Electronically Signed   By: Marcello Moores  Register   On: 04/27/2014 14:03   Ct Knee Left Wo Contrast  04/27/2014   CLINICAL DATA:  Status post fall with a left tibial plateau fracture. Left knee pain.  EXAM: CT OF THE LEFT KNEE WITHOUT CONTRAST  TECHNIQUE: Multidetector CT imaging of the left knee was performed according to the standard protocol. Multiplanar CT image reconstructions were also generated.  COMPARISON:  Plain films left knee earlier this same day.  FINDINGS: The patient has fractures of both the medial and lateral tibial plateaus. The medial tibial plateau fracture is oblique in orientation extending from the central aspect of the tibial plateau in a posterior and inferior orientation through the posterior metaphysis 3.1 cm below the articular surface without depression or displacement. The fracture involves both the medial and lateral tibial eminences with mild comminution.  The lateral tibial plateau is divided into 2 main fragments. The posterior fragment is depressed and angulated and measures approximately 2.4 cm AP by 2.9 cm transverse. The fracture extends from the central aspect of the lateral tibial plateau in a posterior and inferior orientation to a point approximately 3.4 cm below the articular  surface. Depression measures 0.4 cm at the anterior margin of this fracture fragment 1.1 cm at its posterior margin.  No other fracture is identified. Lipohemarthrosis is noted. As visualized by CT scan, the cruciate and collateral ligament complexes appear intact. The posterior horn of the lateral meniscus is detached from the tibia.  IMPRESSION: Fractures of the medial and lateral tibial plateaus with no depression or displacement of the medial tibial plateau and depression and angulation of the posterior lateral tibial plateau as described above. Associated lipohemarthrosis is noted.   Electronically Signed   By: Inge Rise M.D.   On: 04/27/2014 15:13   Dg Knee Complete 4 Views Left  04/27/2014   CLINICAL DATA:  Fall.  Knee pain.  EXAM: LEFT KNEE - COMPLETE 4+ VIEW  COMPARISON:  None.  FINDINGS: There is a comminuted bicondylar tibial plateau fracture with depression of the lateral tibial plateau. The knee is flexed on the lateral view, but there is almost certainly a lipohemarthrosis present. The distal femur and patella appear intact. The fracture extends into the proximal tibial metaphysis. There does not appear to be metaphyseal diaphyseal dissociation.  IMPRESSION: Tibial plateau fracture with depression of the lateral condyle, most compatible with Schatzker 5 fracture. Noncontrast CT with 3 dimensional reconstructions is suggested to further characterize for fixation planning.   Electronically Signed   By: Dereck Ligas M.D.   On: 04/27/2014 14:02   Dg Foot Complete Left  04/27/2014   CLINICAL DATA:  FALL  EXAM: LEFT FOOT - COMPLETE 3+ VIEW  COMPARISON:  None.  FINDINGS: There is no evidence of fracture or dislocation. Plantar calcaneal enthesophyte noted. No other significant degenerative changes. Soft tissues are unremarkable.  IMPRESSION: Negative.   Electronically Signed   By: Jeannine Boga M.D.   On: 04/27/2014 14:03     EKG Interpretation None      MDM   Final diagnoses:   Tibial plateau fracture, left, closed, initial encounter    Re-exam of patient with improvement in pain after receiving dilaudid and toradol injections.  Pedal pulses intact. No pain with flex/ext of left ankle.  Calf soft. Discussed with Dr Ninfa Linden who reviewed studies..  Advised knee immobilizer, crutches,  Ice,  Elevation,  Non weight bearing, pain med and muscle relaxer.  Will see in office in 1 day to assess and plan surgical repair.    Evalee Jefferson, PA-C 04/27/14 1612

## 2014-04-27 NOTE — ED Provider Notes (Signed)
Medical screening examination/treatment/procedure(s) were performed by non-physician practitioner and as supervising physician I was immediately available for consultation/collaboration.   EKG Interpretation None        Orpah Greek, MD 04/27/14 [phone]

## 2014-04-27 NOTE — ED Notes (Signed)
VSS stable. Knee immobilizer applied to left knee by othro tech. Crutches given by ortho tech. Pt has used crutches before. Pt given discharge instructions and prescriptions were reviewed. All questions answered.

## 2014-04-28 ENCOUNTER — Emergency Department (HOSPITAL_COMMUNITY)
Admission: EM | Admit: 2014-04-28 | Discharge: 2014-04-28 | Disposition: A | Discharge status: Home / Self Care | Payer: BC Managed Care – PPO | Attending: Emergency Medicine | Admitting: Emergency Medicine

## 2014-04-28 ENCOUNTER — Encounter (HOSPITAL_COMMUNITY): Payer: Self-pay | Admitting: Emergency Medicine

## 2014-04-28 ENCOUNTER — Other Ambulatory Visit (HOSPITAL_COMMUNITY): Payer: Self-pay | Admitting: Orthopaedic Surgery

## 2014-04-28 DIAGNOSIS — Z8742 Personal history of other diseases of the female genital tract: Secondary | ICD-10-CM | POA: Insufficient documentation

## 2014-04-28 DIAGNOSIS — F411 Generalized anxiety disorder: Secondary | ICD-10-CM | POA: Insufficient documentation

## 2014-04-28 DIAGNOSIS — S8290XD Unspecified fracture of unspecified lower leg, subsequent encounter for closed fracture with routine healing: Secondary | ICD-10-CM | POA: Insufficient documentation

## 2014-04-28 DIAGNOSIS — I10 Essential (primary) hypertension: Secondary | ICD-10-CM | POA: Insufficient documentation

## 2014-04-28 DIAGNOSIS — E119 Type 2 diabetes mellitus without complications: Secondary | ICD-10-CM | POA: Insufficient documentation

## 2014-04-28 DIAGNOSIS — R011 Cardiac murmur, unspecified: Secondary | ICD-10-CM | POA: Insufficient documentation

## 2014-04-28 DIAGNOSIS — S82142D Displaced bicondylar fracture of left tibia, subsequent encounter for closed fracture with routine healing: Secondary | ICD-10-CM

## 2014-04-28 DIAGNOSIS — Z87891 Personal history of nicotine dependence: Secondary | ICD-10-CM | POA: Insufficient documentation

## 2014-04-28 MED ORDER — OXYCODONE-ACETAMINOPHEN 5-325 MG PO TABS
2.0000 | ORAL_TABLET | Freq: Once | ORAL | Status: AC
  Administered 2014-04-28: 2 via ORAL
  Filled 2014-04-28: qty 2

## 2014-04-28 MED ORDER — FENTANYL CITRATE 0.05 MG/ML IJ SOLN
50.0000 ug | Freq: Once | INTRAMUSCULAR | Status: AC
  Administered 2014-04-28: 50 ug via NASAL
  Filled 2014-04-28: qty 2

## 2014-04-28 NOTE — ED Provider Notes (Signed)
CSN: [id number]     Arrival date & time 04/28/14  6294 History   First MD Initiated Contact with Patient 04/28/14 [phone]     Chief Complaint  Patient presents with  . Knee Pain     (Consider location/radiation/quality/duration/timing/severity/associated sxs/prior Treatment) HPI 58 year old female fell yesterday at home causing her left knee tibial plateau fracture patient treated with crutches immobilizer discharged to call orthopedics today for appointment in patient did not take her pain medication this morning but felt like she had too much pain to take her pain medication at home and called EMS for recheck in the emergency department. Her pain is localized to the left knee without radiation without weakness or numbness distally without other concerns. She has no neck pain chest pain shortness of breath cough abdominal pain back pain or other concerns. Her pain is improved with fentanyl from EMS and is now moderate in severity worse with palpation and movement and without radiation or associated symptoms. Past Medical History  Diagnosis Date  . Diabetes mellitus   . Hemorrhoid   . Vaginitis, atrophic   . Heart murmur   . Headache(784.0)   . Hypertension     dr  Revonda Humphrey   Past Surgical History  Procedure Laterality Date  . Cesarean section      x3  . Finger arthroscopy    . Evaluation under anesthesia with hemorrhoidectomy  09/19/2012    Procedure: EXAM UNDER ANESTHESIA WITH HEMORRHOIDECTOMY;  Surgeon: Harl Bowie, MD;  Location: [facility];  Service: General;  Laterality: N/A;  exam under anesthesia hemorrhiodectomy 1 -2 columns   Family History  Problem Relation Age of Onset  . Heart disease Mother   . Cancer Father    History  Substance Use Topics  . Smoking status: Former Smoker    Quit date: 12/25/1983  . Smokeless tobacco: Not on file  . Alcohol Use: No   OB History   Grav Para Term Preterm Abortions TAB SAB Ect Mult Living                 Review of  Systems  10 Systems reviewed and are negative for acute change except as noted in the HPI.  Allergies  Codeine  Home Medications   Prior to Admission medications   Medication Sig Start Date End Date Taking? Authorizing Provider  losartan (COZAAR) 50 MG tablet Take 50 mg by mouth every evening.    Yes Historical Provider, MD  metFORMIN (GLUCOPHAGE) 500 MG tablet Take 500 mg by mouth every evening.    Yes Historical Provider, MD  methocarbamol (ROBAXIN) 500 MG tablet Take 1,000 mg by mouth every 6 (six) hours as needed (pain).   Yes Historical Provider, MD  Multiple Vitamin (MULTIVITAMIN) tablet Take 1 tablet by mouth every morning.    Yes Historical Provider, MD  naproxen sodium (ANAPROX) 220 MG tablet Take 440 mg by mouth 2 (two) times daily as needed (pain).   Yes Historical Provider, MD  oxyCODONE-acetaminophen (PERCOCET/ROXICET) 5-325 MG per tablet Take 1 tablet by mouth every 4 (four) hours as needed for severe pain.   Yes Historical Provider, MD   BP 130/90  Pulse 80  Temp(Src) 98.7 F (37.1 C) (Oral)  Resp 16  SpO2 94% Physical Exam  Nursing note and vitals reviewed. Constitutional:  Awake, alert, nontoxic appearance.  HENT:  Head: Atraumatic.  Eyes: Right eye exhibits no discharge. Left eye exhibits no discharge.  Neck: Neck supple.  Cervical spine and back nontender  Cardiovascular:  Normal rate and regular rhythm.   No murmur heard. Pulmonary/Chest: Effort normal and breath sounds normal. No respiratory distress. She has no wheezes. She has no rales. She exhibits no tenderness.  Abdominal: Soft. Bowel sounds are normal. She exhibits no distension and no mass. There is no tenderness. There is no rebound and no guarding.  Musculoskeletal: She exhibits tenderness. She exhibits no edema.  Baseline ROM, no obvious new focal weakness. Left leg is no tenderness to the hip ankle or foot with foot dorsalis pedis pulse intact capillary refill less than 2 seconds normal light touch  good movement of the left foot left knee has moderate diffuse tenderness with minimal if any swelling with no color change to the skin with no tenderness to the thigh or the calf or hip.  Neurological: She is alert.  Mental status and motor strength appears baseline for patient and situation.  Skin: No rash noted.  Psychiatric:  Patient appears somewhat anxious    ED Course  Procedures (including critical care time) Patient / Family / Caregiver informed of clinical course, understand medical decision-making process, and agree with plan.ED staff d/w Ortho clinic to confirm f/u today. Labs Review Labs Reviewed - No data to display  Imaging Review Dg Hip Complete Left  04/27/2014   CLINICAL DATA:  Left hip pain after fall.  EXAM: LEFT HIP - COMPLETE 2+ VIEW  COMPARISON:  None.  FINDINGS: There is no evidence of hip fracture or dislocation. There is no evidence of arthropathy or other focal bone abnormality.  IMPRESSION: Normal left hip.   Electronically Signed   By: Sabino Dick M.D.   On: 04/27/2014 14:03   Dg Ankle Complete Left  04/27/2014   CLINICAL DATA:  Fall.  EXAM: LEFT ANKLE COMPLETE - 3+ VIEW  COMPARISON:  None.  FINDINGS: Diffuse osteopenia. No evidence of fracture or dislocation. No significant soft tissue swelling.  IMPRESSION: Diffuse mild osteopenia.  No acute abnormality identified.   Electronically Signed   By: Marcello Moores  Register   On: 04/27/2014 14:03   Ct Knee Left Wo Contrast  04/27/2014   CLINICAL DATA:  Status post fall with a left tibial plateau fracture. Left knee pain.  EXAM: CT OF THE LEFT KNEE WITHOUT CONTRAST  TECHNIQUE: Multidetector CT imaging of the left knee was performed according to the standard protocol. Multiplanar CT image reconstructions were also generated.  COMPARISON:  Plain films left knee earlier this same day.  FINDINGS: The patient has fractures of both the medial and lateral tibial plateaus. The medial tibial plateau fracture is oblique in orientation  extending from the central aspect of the tibial plateau in a posterior and inferior orientation through the posterior metaphysis 3.1 cm below the articular surface without depression or displacement. The fracture involves both the medial and lateral tibial eminences with mild comminution.  The lateral tibial plateau is divided into 2 main fragments. The posterior fragment is depressed and angulated and measures approximately 2.4 cm AP by 2.9 cm transverse. The fracture extends from the central aspect of the lateral tibial plateau in a posterior and inferior orientation to a point approximately 3.4 cm below the articular surface. Depression measures 0.4 cm at the anterior margin of this fracture fragment 1.1 cm at its posterior margin.  No other fracture is identified. Lipohemarthrosis is noted. As visualized by CT scan, the cruciate and collateral ligament complexes appear intact. The posterior horn of the lateral meniscus is detached from the tibia.  IMPRESSION: Fractures of the medial and  lateral tibial plateaus with no depression or displacement of the medial tibial plateau and depression and angulation of the posterior lateral tibial plateau as described above. Associated lipohemarthrosis is noted.   Electronically Signed   By: Inge Rise M.D.   On: 04/27/2014 15:13   Dg Knee Complete 4 Views Left  04/27/2014   CLINICAL DATA:  Fall.  Knee pain.  EXAM: LEFT KNEE - COMPLETE 4+ VIEW  COMPARISON:  None.  FINDINGS: There is a comminuted bicondylar tibial plateau fracture with depression of the lateral tibial plateau. The knee is flexed on the lateral view, but there is almost certainly a lipohemarthrosis present. The distal femur and patella appear intact. The fracture extends into the proximal tibial metaphysis. There does not appear to be metaphyseal diaphyseal dissociation.  IMPRESSION: Tibial plateau fracture with depression of the lateral condyle, most compatible with Schatzker 5 fracture. Noncontrast  CT with 3 dimensional reconstructions is suggested to further characterize for fixation planning.   Electronically Signed   By: Dereck Ligas M.D.   On: 04/27/2014 14:02   Dg Foot Complete Left  04/27/2014   CLINICAL DATA:  FALL  EXAM: LEFT FOOT - COMPLETE 3+ VIEW  COMPARISON:  None.  FINDINGS: There is no evidence of fracture or dislocation. Plantar calcaneal enthesophyte noted. No other significant degenerative changes. Soft tissues are unremarkable.  IMPRESSION: Negative.   Electronically Signed   By: Jeannine Boga M.D.   On: 04/27/2014 14:03     EKG Interpretation None      MDM   Final diagnoses:  Tibial plateau fracture, left, closed, with routine healing, subsequent encounter    I doubt any other EMC precluding discharge at this time including, but not necessarily limited to the following:compartment syndrome.    Babette Relic, MD 04/28/14 [phone]

## 2014-04-28 NOTE — ED Notes (Signed)
She states she feel out of a chair yesterday, thereby sustaining a left tibial plateau fracture, for which she was seen at [facility].  She phoned EMS this morning d/t persistent pain in left knee.  She received IV pain meds en route to hospital and arrives here relaxed in no distress.

## 2014-04-28 NOTE — ED Notes (Signed)
Bed: OI51 Expected date:  Expected time:  Means of arrival:  Comments: ems knee fracture

## 2014-04-28 NOTE — Progress Notes (Signed)
Need orders in EPIC please - PT COMING FOR PREOP [place] 04/29/14 - thank you

## 2014-04-28 NOTE — ED Notes (Signed)
We have reapplied knee immobilizer and assisted her into her car without incident.  Ice packs applied in E.D. Sent with pt. Also.  She verbalizes understanding that she needs to see Dr. Ninfa Linden at 1245 this afternoon.  She states she has seen him in the past for other orthopedic needs.

## 2014-04-28 NOTE — Discharge Instructions (Signed)
Your doctor has applied a splint to rest and protect your injury. Try to keep your splint clean and dry. They can be used for weeks if needed to treat serious sprains, or minor fractures. Do not put objects under your splint to scratch yourself. Do not put weight on a splint unless told to do so. Call or return immediately if you have: Increased pain or pressure around the injury.  Numbness, tingling, painful, cool toes or fingers.  Swelling or inability to move fingers or toes.  Color change to fingers or toes (blue or gray).  Sore areas, foul odor, or redness from inside the splint.

## 2014-04-29 ENCOUNTER — Encounter (HOSPITAL_COMMUNITY)
Admission: RE | Admit: 2014-04-29 | Discharge: 2014-04-29 | Disposition: A | Discharge status: Home / Self Care | Payer: BC Managed Care – PPO | Source: Ambulatory Visit | Attending: Orthopaedic Surgery | Admitting: Orthopaedic Surgery

## 2014-04-29 ENCOUNTER — Ambulatory Visit (HOSPITAL_COMMUNITY)
Admission: RE | Admit: 2014-04-29 | Discharge: 2014-04-29 | Disposition: A | Discharge status: Home / Self Care | Payer: BC Managed Care – PPO | Source: Ambulatory Visit | Attending: Anesthesiology | Admitting: Anesthesiology

## 2014-04-29 ENCOUNTER — Encounter (HOSPITAL_COMMUNITY): Payer: Self-pay | Admitting: Pharmacy Technician

## 2014-04-29 ENCOUNTER — Encounter (HOSPITAL_COMMUNITY): Payer: Self-pay

## 2014-04-29 DIAGNOSIS — Z01810 Encounter for preprocedural cardiovascular examination: Secondary | ICD-10-CM | POA: Insufficient documentation

## 2014-04-29 DIAGNOSIS — Z01818 Encounter for other preprocedural examination: Secondary | ICD-10-CM | POA: Insufficient documentation

## 2014-04-29 DIAGNOSIS — Z01812 Encounter for preprocedural laboratory examination: Secondary | ICD-10-CM | POA: Insufficient documentation

## 2014-04-29 LAB — BASIC METABOLIC PANEL
Anion gap: 12 (ref 5–15)
BUN: 10 mg/dL (ref 6–23)
CALCIUM: 10.2 mg/dL (ref 8.4–10.5)
CO2: 25 meq/L (ref 19–32)
Chloride: 104 mEq/L (ref 96–112)
Creatinine, Ser: 0.69 mg/dL (ref 0.50–1.10)
GFR calc Af Amer: >90 mL/min (ref >90)
GFR calc non Af Amer: >90 mL/min (ref >90)
GLUCOSE: 106 mg/dL — AB (ref 70–99)
Potassium: 4.2 mEq/L (ref 3.7–5.3)
SODIUM: 141 meq/L (ref 137–147)

## 2014-04-29 LAB — CBC
HCT: 35.9 % — ABNORMAL LOW (ref 36.0–46.0)
Hemoglobin: 11.8 g/dL — ABNORMAL LOW (ref 12.0–15.0)
MCH: 26.2 pg (ref 26.0–34.0)
MCHC: 32.9 g/dL (ref 30.0–36.0)
MCV: 79.8 fL (ref 78.0–100.0)
Platelets: 238 10*3/uL (ref 150–400)
RBC: 4.5 MIL/uL (ref 3.87–5.11)
RDW: 14 % (ref 11.5–15.5)
WBC: 9.2 10*3/uL (ref 4.0–10.5)

## 2014-04-29 NOTE — Patient Instructions (Addendum)
[place]  04/29/2014   Your procedure is scheduled on:   05-06-2014 Thursday at 47 AM.  Enter through [facility] Entrance and follow signs to [place]. Arrive at    0930    AM ..  Call this number if you have problems the morning of surgery: [phone]  Or Presurgical Testing [phone](Tamberlyn [facility]) For Living Will and/or Health Care Power Attorney Forms: please provide copy for your medical record,may bring AM of surgery(Forms should be already notarized -we do not provide this service).(04-29-14 -information preferred today and given).   .   Do not eat food:After Midnight.   Take these medicines the morning of surgery with A SIP OF WATER: Oxycodone. Tylenol.  Do not take any Diabetic meds, or Methocarbamol  AM of. Avoid Over the counter vitamin supplements, herbal meds x 10 days prior. Avoid Ibuprofen, Aleve products x2 days prior unless advised by MD.   Do not wear jewelry, make-up or nail polish.  Do not wear lotions, powders, or perfumes. You may wear deodorant.  Do not shave 48 hours(2 days) prior to first CHG shower(legs and under arms).(Shaving face and neck okay.)  Do not bring valuables to the hospital.(Hospital is not responsible for lost valuables).  Contacts, dentures or removable bridgework, body piercing, hair pins may not be worn into surgery.  Leave suitcase in the car. After surgery it may be brought to your room.  For patients admitted to the hospital, checkout time is 11:00 AM the day of discharge.(Restricted visitors-Any Persons displaying flu-like symptoms or illness).    Patients discharged the day of surgery will not be allowed to drive home. Must have responsible person with you x 24 hours once discharged.  Name and phone number of your driver:  Alyah Boehning, spouse [phone] cell-  Special Instructions: CHG(Chlorhedine 4%-"Hibiclens","Betasept","Aplicare") Shower Use Special Wash: see special instructions.(avoid face and  genitals)    ___________________    [facility] - Preparing for Surgery Before surgery, you can play an important role.  Because skin is not sterile, your skin needs to be as free of germs as possible.  You can reduce the number of germs on your skin by washing with CHG (chlorahexidine gluconate) soap before surgery.  CHG is an antiseptic cleaner which kills germs and bonds with the skin to continue killing germs even after washing. Please DO NOT use if you have an allergy to CHG or antibacterial soaps.  If your skin becomes reddened/irritated stop using the CHG and inform your nurse when you arrive at Short Stay. Do not shave (including legs and underarms) for at least 48 hours prior to the first CHG shower.  You may shave your face/neck. Please follow these instructions carefully:  1.  Shower with CHG Soap the night before surgery and the  morning of Surgery.  2.  If you choose to wash your hair, wash your hair first as usual with your  normal  shampoo.  3.  After you shampoo, rinse your hair and body thoroughly to remove the  shampoo.                           4.  Use CHG as you would any other liquid soap.  You can apply chg directly  to the skin and wash                       Gently with a scrungie or  clean washcloth.  5.  Apply the CHG Soap to your body ONLY FROM THE NECK DOWN.   Do not use on face/ open                           Wound or open sores. Avoid contact with eyes, ears mouth and genitals (private parts).                       Wash face,  Genitals (private parts) with your normal soap.             6.  Wash thoroughly, paying special attention to the area where your surgery  will be performed.  7.  Thoroughly rinse your body with warm water from the neck down.  8.  DO NOT shower/wash with your normal soap after using and rinsing off  the CHG Soap.                9.  Pat yourself dry with a clean towel.            10.  Wear clean pajamas.            11.  Place clean sheets on your  bed the night of your first shower and do not  sleep with pets. Day of Surgery : Do not apply any lotions/deodorants the morning of surgery.  Please wear clean clothes to the hospital/surgery center.  FAILURE TO FOLLOW THESE INSTRUCTIONS MAY RESULT IN THE CANCELLATION OF YOUR SURGERY PATIENT SIGNATURE_________________________________  NURSE SIGNATURE__________________________________  ________________________________________________________________________

## 2014-05-06 ENCOUNTER — Inpatient Hospital Stay (HOSPITAL_COMMUNITY): Payer: BC Managed Care – PPO

## 2014-05-06 ENCOUNTER — Encounter (HOSPITAL_COMMUNITY): Payer: Self-pay | Admitting: *Deleted

## 2014-05-06 ENCOUNTER — Inpatient Hospital Stay (HOSPITAL_COMMUNITY): Payer: BC Managed Care – PPO | Admitting: Certified Registered Nurse Anesthetist

## 2014-05-06 ENCOUNTER — Ambulatory Visit (HOSPITAL_COMMUNITY)
Admission: RE | Admit: 2014-05-06 | Discharge: 2014-05-08 | Disposition: A | Discharge status: Home / Self Care | Payer: BC Managed Care – PPO | Source: Ambulatory Visit | Attending: Orthopaedic Surgery | Admitting: Orthopaedic Surgery

## 2014-05-06 ENCOUNTER — Ambulatory Visit (HOSPITAL_COMMUNITY): Payer: BC Managed Care – PPO

## 2014-05-06 ENCOUNTER — Encounter (HOSPITAL_COMMUNITY): Payer: BC Managed Care – PPO | Admitting: Certified Registered Nurse Anesthetist

## 2014-05-06 ENCOUNTER — Encounter (HOSPITAL_COMMUNITY)
Admission: RE | Disposition: A | Discharge status: Home / Self Care | Payer: Self-pay | Source: Ambulatory Visit | Attending: Orthopaedic Surgery

## 2014-05-06 DIAGNOSIS — Z87891 Personal history of nicotine dependence: Secondary | ICD-10-CM | POA: Insufficient documentation

## 2014-05-06 DIAGNOSIS — S82143A Displaced bicondylar fracture of unspecified tibia, initial encounter for closed fracture: Secondary | ICD-10-CM

## 2014-05-06 DIAGNOSIS — S82142A Displaced bicondylar fracture of left tibia, initial encounter for closed fracture: Secondary | ICD-10-CM

## 2014-05-06 DIAGNOSIS — W19XXXA Unspecified fall, initial encounter: Secondary | ICD-10-CM | POA: Insufficient documentation

## 2014-05-06 DIAGNOSIS — Y990 Civilian activity done for income or pay: Secondary | ICD-10-CM | POA: Insufficient documentation

## 2014-05-06 DIAGNOSIS — I10 Essential (primary) hypertension: Secondary | ICD-10-CM | POA: Insufficient documentation

## 2014-05-06 DIAGNOSIS — D649 Anemia, unspecified: Secondary | ICD-10-CM | POA: Insufficient documentation

## 2014-05-06 DIAGNOSIS — E119 Type 2 diabetes mellitus without complications: Secondary | ICD-10-CM | POA: Insufficient documentation

## 2014-05-06 DIAGNOSIS — S82109A Unspecified fracture of upper end of unspecified tibia, initial encounter for closed fracture: Principal | ICD-10-CM | POA: Insufficient documentation

## 2014-05-06 HISTORY — PX: ORIF TIBIA PLATEAU: SHX2132

## 2014-05-06 LAB — GLUCOSE, CAPILLARY
GLUCOSE-CAPILLARY: 144 mg/dL — AB (ref 70–99)
Glucose-Capillary: 112 mg/dL — ABNORMAL HIGH (ref 70–99)
Glucose-Capillary: 79 mg/dL (ref 70–99)
Glucose-Capillary: 136 mg/dL — ABNORMAL HIGH (ref 70–99)
Glucose-Capillary: 147 mg/dL — ABNORMAL HIGH (ref 70–99)

## 2014-05-06 LAB — PROTIME-INR
INR: 1.01 (ref 0.00–1.49)
Prothrombin Time: 13.3 seconds (ref 11.6–15.2)

## 2014-05-06 SURGERY — OPEN REDUCTION INTERNAL FIXATION (ORIF) TIBIAL PLATEAU
Anesthesia: General | Laterality: Left

## 2014-05-06 MED ORDER — CEFAZOLIN SODIUM 1-5 GM-% IV SOLN
1.0000 g | Freq: Four times a day (QID) | INTRAVENOUS | Status: AC
  Administered 2014-05-06 – 2014-05-07 (×3): 1 g via INTRAVENOUS
  Filled 2014-05-06 (×3): qty 50

## 2014-05-06 MED ORDER — METHOCARBAMOL 1000 MG/10ML IJ SOLN
500.0000 mg | Freq: Four times a day (QID) | INTRAMUSCULAR | Status: DC | PRN
  Administered 2014-05-06: 500 mg via INTRAVENOUS
  Filled 2014-05-06: qty 5

## 2014-05-06 MED ORDER — CEFAZOLIN SODIUM-DEXTROSE 2-3 GM-% IV SOLR
2.0000 g | INTRAVENOUS | Status: AC
  Administered 2014-05-06: 2 g via INTRAVENOUS

## 2014-05-06 MED ORDER — HYDROCODONE-ACETAMINOPHEN 5-325 MG PO TABS
1.0000 | ORAL_TABLET | ORAL | Status: DC | PRN
  Administered 2014-05-06 (×3): 1 via ORAL
  Administered 2014-05-07 (×2): 2 via ORAL
  Filled 2014-05-06: qty 2
  Filled 2014-05-06 (×3): qty 1
  Filled 2014-05-06: qty 2

## 2014-05-06 MED ORDER — POLYETHYLENE GLYCOL 3350 17 G PO PACK
17.0000 g | PACK | Freq: Every day | ORAL | Status: DC | PRN
  Administered 2014-05-06 – 2014-05-08 (×2): 17 g via ORAL

## 2014-05-06 MED ORDER — HYDROMORPHONE HCL PF 1 MG/ML IJ SOLN
0.2500 mg | INTRAMUSCULAR | Status: DC | PRN
  Administered 2014-05-06 (×4): 0.5 mg via INTRAVENOUS

## 2014-05-06 MED ORDER — METHOCARBAMOL 500 MG PO TABS
500.0000 mg | ORAL_TABLET | Freq: Four times a day (QID) | ORAL | Status: DC | PRN
  Administered 2014-05-06 – 2014-05-08 (×7): 500 mg via ORAL
  Filled 2014-05-06 (×7): qty 1

## 2014-05-06 MED ORDER — SODIUM CHLORIDE 0.9 % IV SOLN
INTRAVENOUS | Status: DC
  Administered 2014-05-06: 16:00:00 via INTRAVENOUS
  Administered 2014-05-07: 30 mL/h via INTRAVENOUS

## 2014-05-06 MED ORDER — HYDROMORPHONE HCL PF 1 MG/ML IJ SOLN
INTRAMUSCULAR | Status: AC
  Filled 2014-05-06: qty 1

## 2014-05-06 MED ORDER — COUMADIN BOOK
Freq: Once | Status: AC
  Administered 2014-05-06: 20:00:00
  Filled 2014-05-06 (×2): qty 1

## 2014-05-06 MED ORDER — INSULIN ASPART 100 UNIT/ML ~~LOC~~ SOLN
0.0000 [IU] | Freq: Three times a day (TID) | SUBCUTANEOUS | Status: DC
  Administered 2014-05-06: 2 [IU] via SUBCUTANEOUS

## 2014-05-06 MED ORDER — ONDANSETRON HCL 4 MG/2ML IJ SOLN
INTRAMUSCULAR | Status: AC
  Filled 2014-05-06: qty 2

## 2014-05-06 MED ORDER — ACETAMINOPHEN 10 MG/ML IV SOLN
1000.0000 mg | Freq: Once | INTRAVENOUS | Status: AC
  Administered 2014-05-06: 1000 mg via INTRAVENOUS
  Filled 2014-05-06: qty 100

## 2014-05-06 MED ORDER — METOCLOPRAMIDE HCL 5 MG/ML IJ SOLN
INTRAMUSCULAR | Status: DC | PRN
  Administered 2014-05-06: 10 mg via INTRAVENOUS

## 2014-05-06 MED ORDER — FENTANYL CITRATE 0.05 MG/ML IJ SOLN
INTRAMUSCULAR | Status: AC
  Filled 2014-05-06: qty 2

## 2014-05-06 MED ORDER — HYDROMORPHONE HCL PF 1 MG/ML IJ SOLN
1.0000 mg | INTRAMUSCULAR | Status: DC | PRN
  Administered 2014-05-07 (×4): 1 mg via INTRAVENOUS
  Filled 2014-05-06 (×4): qty 1

## 2014-05-06 MED ORDER — LIDOCAINE HCL (CARDIAC) 20 MG/ML IV SOLN
INTRAVENOUS | Status: AC
  Filled 2014-05-06: qty 5

## 2014-05-06 MED ORDER — WARFARIN VIDEO
Freq: Once | Status: AC
  Administered 2014-05-06: 18:00:00

## 2014-05-06 MED ORDER — WARFARIN - PHARMACIST DOSING INPATIENT
Freq: Every day | Status: DC
Start: 2014-05-06 — End: 2014-05-08

## 2014-05-06 MED ORDER — DIPHENHYDRAMINE HCL 12.5 MG/5ML PO ELIX
12.5000 mg | ORAL_SOLUTION | ORAL | Status: DC | PRN
Start: 2014-05-06 — End: 2014-05-08

## 2014-05-06 MED ORDER — LABETALOL HCL 5 MG/ML IV SOLN
5.0000 mg | Freq: Once | INTRAVENOUS | Status: AC
  Administered 2014-05-06: 5 mg via INTRAVENOUS

## 2014-05-06 MED ORDER — ONDANSETRON HCL 4 MG PO TABS: 4.0000 mg | ORAL_TABLET | Freq: Four times a day (QID) | ORAL | Status: DC | PRN

## 2014-05-06 MED ORDER — DEXAMETHASONE SODIUM PHOSPHATE 10 MG/ML IJ SOLN
INTRAMUSCULAR | Status: AC
  Filled 2014-05-06: qty 1

## 2014-05-06 MED ORDER — MIDAZOLAM HCL 2 MG/2ML IJ SOLN
1.0000 mg | INTRAMUSCULAR | Status: DC | PRN
  Administered 2014-05-06: 2 mg via INTRAVENOUS

## 2014-05-06 MED ORDER — CEFAZOLIN SODIUM-DEXTROSE 2-3 GM-% IV SOLR
INTRAVENOUS | Status: AC
  Filled 2014-05-06: qty 50

## 2014-05-06 MED ORDER — METOCLOPRAMIDE HCL 5 MG/ML IJ SOLN
5.0000 mg | Freq: Three times a day (TID) | INTRAMUSCULAR | Status: DC | PRN
Start: 2014-05-06 — End: 2014-05-08

## 2014-05-06 MED ORDER — PROPOFOL 10 MG/ML IV BOLUS
INTRAVENOUS | Status: AC
  Filled 2014-05-06: qty 20

## 2014-05-06 MED ORDER — LABETALOL HCL 5 MG/ML IV SOLN
INTRAVENOUS | Status: DC | PRN
  Administered 2014-05-06: 5 mg via INTRAVENOUS

## 2014-05-06 MED ORDER — HYDROMORPHONE HCL PF 1 MG/ML IJ SOLN
0.2500 mg | INTRAMUSCULAR | Status: DC | PRN
  Administered 2014-05-06 (×2): 0.5 mg via INTRAVENOUS

## 2014-05-06 MED ORDER — MIDAZOLAM HCL 2 MG/2ML IJ SOLN
INTRAMUSCULAR | Status: AC
  Filled 2014-05-06: qty 2

## 2014-05-06 MED ORDER — LACTATED RINGERS IV SOLN
INTRAVENOUS | Status: DC
Start: 2014-05-06 — End: 2014-05-06
  Administered 2014-05-06: 1000 mL via INTRAVENOUS
  Administered 2014-05-06: 13:00:00 via INTRAVENOUS

## 2014-05-06 MED ORDER — DOCUSATE SODIUM 100 MG PO CAPS
100.0000 mg | ORAL_CAPSULE | Freq: Two times a day (BID) | ORAL | Status: DC
  Administered 2014-05-06 – 2014-05-08 (×4): 100 mg via ORAL

## 2014-05-06 MED ORDER — OXYCODONE HCL 5 MG PO TABS
5.0000 mg | ORAL_TABLET | ORAL | Status: DC | PRN
Start: 2014-05-06 — End: 2014-05-08
  Administered 2014-05-07: 5 mg via ORAL
  Administered 2014-05-07: 10 mg via ORAL
  Administered 2014-05-07: 5 mg via ORAL
  Administered 2014-05-07 – 2014-05-08 (×4): 10 mg via ORAL
  Filled 2014-05-06: qty 2
  Filled 2014-05-06: qty 1
  Filled 2014-05-06: qty 2
  Filled 2014-05-06: qty 1
  Filled 2014-05-06 (×3): qty 2

## 2014-05-06 MED ORDER — FENTANYL CITRATE 0.05 MG/ML IJ SOLN
50.0000 ug | INTRAMUSCULAR | Status: DC | PRN
  Administered 2014-05-06: 100 ug via INTRAVENOUS

## 2014-05-06 MED ORDER — LOSARTAN POTASSIUM 50 MG PO TABS
50.0000 mg | ORAL_TABLET | Freq: Every morning | ORAL | Status: DC
  Administered 2014-05-07 – 2014-05-08 (×2): 50 mg via ORAL
  Filled 2014-05-06 (×2): qty 1

## 2014-05-06 MED ORDER — MORPHINE SULFATE 2 MG/ML IJ SOLN: 1.0000 mg | INTRAMUSCULAR | Status: DC | PRN

## 2014-05-06 MED ORDER — METFORMIN HCL 500 MG PO TABS
500.0000 mg | ORAL_TABLET | Freq: Every day | ORAL | Status: DC
Start: 2014-05-07 — End: 2014-05-08
  Administered 2014-05-07 – 2014-05-08 (×2): 500 mg via ORAL
  Filled 2014-05-06 (×4): qty 1

## 2014-05-06 MED ORDER — ONDANSETRON HCL 4 MG/2ML IJ SOLN
INTRAMUSCULAR | Status: DC | PRN
  Administered 2014-05-06: 4 mg via INTRAVENOUS

## 2014-05-06 MED ORDER — EPHEDRINE SULFATE 50 MG/ML IJ SOLN
INTRAMUSCULAR | Status: AC
  Filled 2014-05-06: qty 1

## 2014-05-06 MED ORDER — WARFARIN SODIUM 7.5 MG PO TABS
7.5000 mg | ORAL_TABLET | Freq: Once | ORAL | Status: AC
  Administered 2014-05-06: 7.5 mg via ORAL
  Filled 2014-05-06: qty 1

## 2014-05-06 MED ORDER — METOCLOPRAMIDE HCL 10 MG PO TABS: 5.0000 mg | ORAL_TABLET | Freq: Three times a day (TID) | ORAL | Status: DC | PRN

## 2014-05-06 MED ORDER — PROMETHAZINE HCL 25 MG/ML IJ SOLN
6.2500 mg | INTRAMUSCULAR | Status: DC | PRN
Start: 2014-05-06 — End: 2014-05-06

## 2014-05-06 MED ORDER — SODIUM CHLORIDE 0.9 % IJ SOLN
INTRAMUSCULAR | Status: AC
  Filled 2014-05-06: qty 10

## 2014-05-06 MED ORDER — FENTANYL CITRATE 0.05 MG/ML IJ SOLN
INTRAMUSCULAR | Status: AC
  Filled 2014-05-06: qty 5

## 2014-05-06 MED ORDER — PROPOFOL 10 MG/ML IV BOLUS
INTRAVENOUS | Status: DC | PRN
Start: 2014-05-06 — End: 2014-05-06
  Administered 2014-05-06: 180 mg via INTRAVENOUS

## 2014-05-06 MED ORDER — FENTANYL CITRATE 0.05 MG/ML IJ SOLN
INTRAMUSCULAR | Status: DC | PRN
  Administered 2014-05-06: 50 ug via INTRAVENOUS
  Administered 2014-05-06: 25 ug via INTRAVENOUS
  Administered 2014-05-06 (×2): 50 ug via INTRAVENOUS
  Administered 2014-05-06: 25 ug via INTRAVENOUS
  Administered 2014-05-06 (×3): 50 ug via INTRAVENOUS

## 2014-05-06 MED ORDER — DEXAMETHASONE SODIUM PHOSPHATE 10 MG/ML IJ SOLN: INTRAMUSCULAR | Status: DC | PRN

## 2014-05-06 MED ORDER — METOCLOPRAMIDE HCL 5 MG/ML IJ SOLN
INTRAMUSCULAR | Status: AC
  Filled 2014-05-06: qty 2

## 2014-05-06 MED ORDER — LIDOCAINE HCL (CARDIAC) 20 MG/ML IV SOLN
INTRAVENOUS | Status: DC | PRN
  Administered 2014-05-06: 80 mg via INTRAVENOUS

## 2014-05-06 MED ORDER — ONDANSETRON HCL 4 MG/2ML IJ SOLN: 4.0000 mg | Freq: Four times a day (QID) | INTRAMUSCULAR | Status: DC | PRN

## 2014-05-06 SURGICAL SUPPLY — 76 items
BAG SPEC THK2 15X12 ZIP CLS (MISCELLANEOUS) ×1
BAG ZIPLOCK 12X15 (MISCELLANEOUS) ×3 IMPLANT
BANDAGE ELASTIC 4 VELCRO ST LF (GAUZE/BANDAGES/DRESSINGS) ×3 IMPLANT
BANDAGE ELASTIC 6 VELCRO ST LF (GAUZE/BANDAGES/DRESSINGS) ×3 IMPLANT
BANDAGE ESMARK 6X9 LF (GAUZE/BANDAGES/DRESSINGS) ×1 IMPLANT
BIT DRILL 2.7 QC 7.9IN LONG (BIT) ×2 IMPLANT
BIT DRILL QC 2.7 6.3IN  SHORT (BIT) ×2
BIT DRILL QC 2.7 6.3IN SHORT (BIT) IMPLANT
BNDG CMPR 82X61 PLY HI ABS (GAUZE/BANDAGES/DRESSINGS) ×1
BNDG CMPR 9X6 STRL LF SNTH (GAUZE/BANDAGES/DRESSINGS) ×1
BNDG COHESIVE 4X5 TAN STRL (GAUZE/BANDAGES/DRESSINGS) ×3 IMPLANT
BNDG COHESIVE 6X5 TAN STRL LF (GAUZE/BANDAGES/DRESSINGS) ×3 IMPLANT
BNDG CONFORM 6X.82 1P STRL (GAUZE/BANDAGES/DRESSINGS) ×2 IMPLANT
BNDG ESMARK 6X9 LF (GAUZE/BANDAGES/DRESSINGS) ×3
BNDG GAUZE ELAST 4 BULKY (GAUZE/BANDAGES/DRESSINGS) ×3 IMPLANT
CUFF TOURN SGL QUICK 34 (TOURNIQUET CUFF) ×3
CUFF TRNQT CYL 34X4X40X1 (TOURNIQUET CUFF) ×1 IMPLANT
DECANTER SPIKE VIAL GLASS SM (MISCELLANEOUS) ×3 IMPLANT
DRAPE C-ARM 42X120 X-RAY (DRAPES) ×3 IMPLANT
DRAPE ORTHO SPLIT 77X108 STRL (DRAPES) ×6
DRAPE SURG ORHT 6 SPLT 77X108 (DRAPES) ×2 IMPLANT
DRAPE U-SHAPE 47X51 STRL (DRAPES) ×3 IMPLANT
DRSG EMULSION OIL 3X16 NADH (GAUZE/BANDAGES/DRESSINGS) ×3 IMPLANT
DRSG PAD ABDOMINAL 8X10 ST (GAUZE/BANDAGES/DRESSINGS) ×3 IMPLANT
DURAPREP 26ML APPLICATOR (WOUND CARE) ×3 IMPLANT
ELECT REM PT RETURN 9FT ADLT (ELECTROSURGICAL) ×3
ELECTRODE REM PT RTRN 9FT ADLT (ELECTROSURGICAL) ×1 IMPLANT
EVACUATOR 1/8 PVC DRAIN (DRAIN) IMPLANT
GAUZE SPONGE 4X4 12PLY STRL (GAUZE/BANDAGES/DRESSINGS) ×3 IMPLANT
GAUZE XEROFORM 5X9 LF (GAUZE/BANDAGES/DRESSINGS) ×2 IMPLANT
GLOVE BIO SURGEON STRL SZ7.5 (GLOVE) ×3 IMPLANT
GLOVE BIOGEL PI IND STRL 8 (GLOVE) ×2 IMPLANT
GLOVE BIOGEL PI INDICATOR 8 (GLOVE) ×4
GLOVE ECLIPSE 8.0 STRL XLNG CF (GLOVE) ×3 IMPLANT
GLOVE ORTHO TXT STRL SZ7.5 (GLOVE) ×3 IMPLANT
GOWN STRL REUS W/TWL XL LVL3 (GOWN DISPOSABLE) ×3 IMPLANT
IMMOBILIZER KNEE 16 (SOFTGOODS) ×2 IMPLANT
IMMOBILIZER KNEE 16 UNIV (MISCELLANEOUS) IMPLANT
IMMOBILIZER KNEE 20 (SOFTGOODS)
IMMOBILIZER KNEE 20 THIGH 36 (SOFTGOODS) IMPLANT
K-WIRE 1.6 (WIRE) ×3
K-WIRE FX150X1.6XTROC PNT (WIRE) ×1
KIT BASIN OR (CUSTOM PROCEDURE TRAY) ×3 IMPLANT
KWIRE FX150X1.6XTROC PNT (WIRE) IMPLANT
MANIFOLD NEPTUNE II (INSTRUMENTS) ×3 IMPLANT
NS IRRIG 1000ML POUR BTL (IV SOLUTION) ×3 IMPLANT
PACK LOWER EXTREMITY WL (CUSTOM PROCEDURE TRAY) ×3 IMPLANT
PAD ABD 8X10 STRL (GAUZE/BANDAGES/DRESSINGS) ×2 IMPLANT
PAD CAST 4YDX4 CTTN HI CHSV (CAST SUPPLIES) ×1 IMPLANT
PADDING CAST COTTON 4X4 STRL (CAST SUPPLIES) ×3
PADDING CAST COTTON 6X4 STRL (CAST SUPPLIES) ×3 IMPLANT
PASSER SUT SWANSON 36MM LOOP (INSTRUMENTS) ×3 IMPLANT
PLATE PROC TIBIA 4H L KNEE (Plate) ×2 IMPLANT
POSITIONER SURGICAL ARM (MISCELLANEOUS) ×3 IMPLANT
SCREW 42 (Screw) ×2 IMPLANT
SCREW LOCK 3.5X65 (Screw) ×3 IMPLANT
SCREW LOCK 3.5X75 (Screw) ×2 IMPLANT
SCREW LOCK 3.5X80 (Screw) ×4 IMPLANT
SCREW LOCK T20 65X3.5XST (Screw) IMPLANT
SCREW NONLOCK 3.5X36 (Screw) ×2 IMPLANT
SET PAD KNEE POSITIONER (MISCELLANEOUS) ×3 IMPLANT
SPONGE LAP 18X18 X RAY DECT (DISPOSABLE) ×6 IMPLANT
SPONGE LAP 4X18 X RAY DECT (DISPOSABLE) ×6 IMPLANT
STAPLER VISISTAT 35W (STAPLE) ×3 IMPLANT
STOCKINETTE 8 INCH (MISCELLANEOUS) ×3 IMPLANT
SUCTION FRAZIER TIP 10 FR DISP (SUCTIONS) ×3 IMPLANT
SUT ETHIBOND NAB CT1 #1 30IN (SUTURE) ×6 IMPLANT
SUT VIC AB 0 CT1 27 (SUTURE) ×6
SUT VIC AB 0 CT1 27XBRD ANTBC (SUTURE) ×2 IMPLANT
SUT VIC AB 1 CT1 27 (SUTURE) ×3
SUT VIC AB 1 CT1 27XBRD ANTBC (SUTURE) ×1 IMPLANT
SUT VIC AB 2-0 CT1 27 (SUTURE) ×6
SUT VIC AB 2-0 CT1 TAPERPNT 27 (SUTURE) ×1 IMPLANT
TOWEL OR 17X26 10 PK STRL BLUE (TOWEL DISPOSABLE) ×6 IMPLANT
TRAY FOLEY CATH 14FRSI W/METER (CATHETERS) ×2 IMPLANT
WATER STERILE IRR 1500ML POUR (IV SOLUTION) ×3 IMPLANT

## 2014-05-06 NOTE — Progress Notes (Signed)
CARE MANAGEMENT NOTE 05/06/2014  Patient:  AIMI, ESSNER   Account Number:  [id number]  Date Initiated:  05/06/2014  Documentation initiated by:  DAVIS,RHONDA  Subjective/Objective Assessment:   OPEN REDUCTION INTERNAL FIXATION (ORIF) LEFT TIBIAL PLATEAU (Left)     Action/Plan:   home when stable   Anticipated DC Date:  05/08/2014   Anticipated DC Plan:  [place]  In-house referral  NA      DC Planning Services  CM consult      [facility] Choice  NA   Choice offered to / List presented to:  C-1 Patient           Status of service:  In process, will continue to follow Medicare Important Message given?  NA - LOS <3 / Initial given by admissions (If response is "NO", the following Medicare IM given date fields will be blank) Date Medicare IM given:   Medicare IM given by:   Date Additional Medicare IM given:   Additional Medicare IM given by:    Discharge Disposition:    Per UR Regulation:  Reviewed for med. necessity/level of care/duration of stay  If discussed at [place] of Stay Meetings, dates discussed:    Comments:  [id number]/Rhonda Rosana Hoes, RN, BSN, CCM: CHART REVIEWED AND UPDATED.  Next chart review due on [id number]. NO DISCHARGE NEEDS PRESENT AT THIS TIME WILL CONTINUE TO FOLLOW. CASE MANAGEMENT [phone]

## 2014-05-06 NOTE — H&P (Signed)
Heidi Lewis is an 58 y.o. female.   Chief Complaint:   Left knee pain; known tibial plateau fracture HPI:   58 yo female who sustained a mechanical fall at work.  Landed on her left knee.  Was seen in the ED and found to have a left tibial plateau fracture.  She now presents for definitive fixation since her soft-tissue swelling has subsided.  She understands fully the risks of surgery including infection, nerve and vessel injury, and DVT.  Past Medical History  Diagnosis Date  . Diabetes mellitus   . Hemorrhoid   . Vaginitis, atrophic   . Heart murmur   . Headache(784.0)   . Hypertension     dr  Revonda Humphrey    Past Surgical History  Procedure Laterality Date  . Cesarean section      x3  . Finger arthroscopy    . Evaluation under anesthesia with hemorrhoidectomy  09/19/2012    Procedure: EXAM UNDER ANESTHESIA WITH HEMORRHOIDECTOMY;  Surgeon: Harl Bowie, MD;  Location: [place];  Service: General;  Laterality: N/A;  exam under anesthesia hemorrhiodectomy 1 -2 columns    Family History  Problem Relation Age of Onset  . Heart disease Mother   . Cancer Father    Social History:  reports that she quit smoking about 30 years ago. She does not have any smokeless tobacco history on file. She reports that she does not drink alcohol or use illicit drugs.  Allergies:  Allergies  Allergen Reactions  . Codeine Nausea Only    No prescriptions prior to admission    No results found for this or any previous visit (from the past 48 hour(s)). No results found.  Review of Systems  All other systems reviewed and are negative.   There were no vitals taken for this visit. Physical Exam  Constitutional: She is oriented to person, place, and time. She appears well-developed and well-nourished.  HENT:  Head: Normocephalic and atraumatic.  Eyes: EOM are normal. Pupils are equal, round, and reactive to light.  Neck: Normal range of motion. Neck supple.  Cardiovascular: Normal rate and  regular rhythm.   Respiratory: Effort normal and breath sounds normal.  GI: Soft. Bowel sounds are normal.  Musculoskeletal:       Left knee: She exhibits decreased range of motion, swelling and effusion. Tenderness found. Medial joint line and lateral joint line tenderness noted.  Neurological: She is alert and oriented to person, place, and time.  Skin: Skin is warm and dry.  Psychiatric: She has a normal mood and affect.     Assessment/Plan Displaced left knee tibial plateau fracture 1)  To the OR today for pen reduction/internal fixation of her left knee tibial plateau fracture  Rexann Lueras Y 05/06/2014, 7:23 AM

## 2014-05-06 NOTE — Transfer of Care (Signed)
Immediate Anesthesia Transfer of Care Note  Patient: Heidi Lewis  Procedure(s) Performed: Procedure(s) (LRB): OPEN REDUCTION INTERNAL FIXATION (ORIF) LEFT TIBIAL PLATEAU (Left)  Patient Location: PACU  Anesthesia Type: GA combined with regional for post-op pain  Level of Consciousness: sedated, patient cooperative and responds to stimulation  Airway & Oxygen Therapy: Patient Spontanous Breathing and Patient connected to face mask oxgen  Post-op Assessment: Report given to PACU RN and Post -op Vital signs reviewed and stable  Post vital signs: Reviewed and stable  Complications: No apparent anesthesia complications

## 2014-05-06 NOTE — Progress Notes (Addendum)
ANTICOAGULATION CONSULT NOTE - Initial Consult  Pharmacy Consult for Warfarin Indication: VTE prophylaxis  Allergies  Allergen Reactions  . Codeine Nausea Only    Patient Measurements: Height: 5\' 2"  (157.5 cm) Weight: 179 lb (81.194 kg) IBW/kg (Calculated) : 50.1  Vital Signs: Temp: 98.6 F (37 C) (08/06 1500) Temp src: Oral (08/06 0826) BP: 150/92 mmHg (08/06 1500) Pulse Rate: 91 (08/06 1500)  Labs:  Recent Labs  05/06/14 1528  LABPROT 13.3  INR 1.01    Estimated Creatinine Clearance: 77.5 ml/min (by C-G formula based on Cr of 0.69).   Medical History: Past Medical History  Diagnosis Date  . Diabetes mellitus   . Hemorrhoid   . Vaginitis, atrophic   . Heart murmur   . Headache(784.0)   . Hypertension     dr  Revonda Humphrey    Medications:  Prescriptions prior to admission  Medication Sig Dispense Refill  . ibuprofen (ADVIL,MOTRIN) 800 MG tablet Take 800 mg by mouth every 8 (eight) hours as needed for moderate pain.      Marland Kitchen losartan (COZAAR) 50 MG tablet Take 50 mg by mouth every morning.       . metFORMIN (GLUCOPHAGE) 500 MG tablet Take 500 mg by mouth daily with breakfast.       . methocarbamol (ROBAXIN) 500 MG tablet Take 1,000 mg by mouth every 6 (six) hours as needed for muscle spasms.       . Multiple Vitamin (MULTIVITAMIN) tablet Take 1 tablet by mouth every morning.       Marland Kitchen oxyCODONE-acetaminophen (PERCOCET/ROXICET) 5-325 MG per tablet Take 1 tablet by mouth every 4 (four) hours as needed for severe pain.      . naproxen sodium (ANAPROX) 220 MG tablet Take 440 mg by mouth 2 (two) times daily as needed (pain).       Scheduled:  .  ceFAZolin (ANCEF) IV  1 g Intravenous Q6H  . docusate sodium  100 mg Oral BID  . insulin aspart  0-15 Units Subcutaneous TID WC  . [START ON 05/07/2014] losartan  50 mg Oral q morning - 10a  . [START ON 05/07/2014] metFORMIN  500 mg Oral Q breakfast   Infusions:  . sodium chloride     PRN: diphenhydrAMINE,  HYDROcodone-acetaminophen, HYDROmorphone (DILAUDID) injection, methocarbamol (ROBAXIN) IV, methocarbamol, metoCLOPramide (REGLAN) injection, metoCLOPramide, morphine injection, ondansetron (ZOFRAN) IV, ondansetron, oxyCODONE, polyethylene glycol  Assessment: 74 yoF admitted 8/6 with left knee pain and known tibial plateau fracture after mechanical fall; s/p ORIF today.  AET 1340.  Pharmacy is consulted to dose warfarin for VTE prophylaxis.  Baseline INR 1.01  CBC (7/30): Hgb 11.8, Plt 238   Goal of Therapy:  INR 2-3 Monitor platelets by anticoagulation protocol: Yes   Plan:   Warfarin 7.5mg  PO once today at 1800  Daily INR, CBC  Warfarin book and video  Warfarin education prior to discharge  Gretta Arab PharmD, BCPS Pager [phone] 05/06/2014 3:11 PM

## 2014-05-06 NOTE — Anesthesia Procedure Notes (Signed)
Anesthesia Regional Block:  Femoral nerve block  Pre-Anesthetic Checklist: ,, timeout performed, Correct Patient, Correct Site, Correct Laterality, Correct Procedure, Correct Position, site marked, Risks and benefits discussed,  Surgical consent,  Pre-op evaluation,  At surgeon's request and post-op pain management  Laterality: Left  Prep: chloraprep       Needles:  Injection technique: Single-shot  Needle Type: Stimiplex     Needle Length: 9cm 9 cm Needle Gauge: 21 and 21 G  Needle insertion depth: 5 cm   Additional Needles:  Procedures: ultrasound guided (picture in chart) and nerve stimulator Femoral nerve block  Nerve Stimulator or Paresthesia:  Response: Patellar response noted, 1 mA, 5 cm  Additional Responses:   Narrative:  Start time: 05/06/2014 11:40 AM Injection made incrementally with aspirations every 5 mL.  Performed by: Personally  Anesthesiologist: Ola Spurr, MD  Additional Notes: Femoral artery and nerve identified on u/s using linear probe. Needle advanced under live u/s guidance with linear probe. Patellar response noted at 1.70mA and stimulator turned off. Injection of LA performed under live u/s guidance with visualization of spread of LA around nerve.

## 2014-05-06 NOTE — Brief Op Note (Signed)
05/06/2014  1:20 PM  PATIENT:  Heidi Lewis  58 y.o. female  PRE-OPERATIVE DIAGNOSIS:  Left knee bicondylar tibial plateau fracture  POST-OPERATIVE DIAGNOSIS:  Left knee bicondylar tibial plateau fracture  PROCEDURE:  Procedure(s): OPEN REDUCTION INTERNAL FIXATION (ORIF) LEFT TIBIAL PLATEAU (Left)  SURGEON:  Surgeon(s) and Role:    * Mcarthur Rossetti, MD - Primary  PHYSICIAN ASSISTANT: Benita Stabile, PA-C  ANESTHESIA:   regional and general  EBL:  Total I/O In: 1000 [I.V.:1000] Out: 320 [Urine:300; Blood:20]  BLOOD ADMINISTERED:none  DRAINS: none   LOCAL MEDICATIONS USED:  NONE  SPECIMEN:  No Specimen  DISPOSITION OF SPECIMEN:  N/A  COUNTS:  YES  TOURNIQUET:  * Missing tourniquet times found for documented tourniquets in log:  [id number] *  DICTATION: .Other Dictation: Dictation Number [id number]  PLAN OF CARE: Admit to inpatient   PATIENT DISPOSITION:  PACU - hemodynamically stable.   Delay start of Pharmacological VTE agent (>24hrs) due to surgical blood loss or risk of bleeding: no

## 2014-05-06 NOTE — Anesthesia Postprocedure Evaluation (Signed)
  Anesthesia Post-op Note  Patient: Heidi Lewis  Procedure(s) Performed: Procedure(s): OPEN REDUCTION INTERNAL FIXATION (ORIF) LEFT TIBIAL PLATEAU (Left)  Patient Location: PACU  Anesthesia Type:General  Level of Consciousness: awake, alert  and oriented  Airway and Oxygen Therapy: Patient Spontanous Breathing  Post-op Pain: mild  Post-op Assessment: Post-op Vital signs reviewed  Post-op Vital Signs: Reviewed. Hypertension treated with Labetalol in PACU.  Last Vitals:  Filed Vitals:   05/06/14 1415  BP: 161/97  Pulse: 92  Temp:   Resp: 15    Complications: No apparent anesthesia complications

## 2014-05-06 NOTE — Anesthesia Preprocedure Evaluation (Addendum)
Anesthesia Evaluation  Patient identified by MRN, date of birth, ID band Patient awake    Reviewed: Allergy & Precautions, H&P , NPO status , Patient's Chart, lab work & pertinent test results  Airway Mallampati: I TM Distance: >3 FB Neck ROM: Full    Dental  (+) Edentulous Upper, Dental Advisory Given   Pulmonary former smoker,  breath sounds clear to auscultation        Cardiovascular hypertension, Pt. on medications Rhythm:Regular Rate:Normal     Neuro/Psych  Headaches, negative psych ROS   GI/Hepatic negative GI ROS, Neg liver ROS,   Endo/Other  diabetes, Well Controlled, Type 2, Oral Hypoglycemic AgentsMorbid obesity  Renal/GU negative Renal ROS  negative genitourinary   Musculoskeletal negative musculoskeletal ROS (+)   Abdominal   Peds  Hematology  (+) anemia ,   Anesthesia Other Findings   Reproductive/Obstetrics negative OB ROS                          Anesthesia Physical Anesthesia Plan  ASA: II  Anesthesia Plan: General   Post-op Pain Management:    Induction: Intravenous  Airway Management Planned: LMA  Additional Equipment: None  Intra-op Plan:   Post-operative Plan: Extubation in OR  Informed Consent: I have reviewed the patients History and Physical, chart, labs and discussed the procedure including the risks, benefits and alternatives for the proposed anesthesia with the patient or authorized representative who has indicated his/her understanding and acceptance.   Dental advisory given  Plan Discussed with: CRNA and Surgeon  Anesthesia Plan Comments:        Anesthesia Quick Evaluation

## 2014-05-06 NOTE — Op Note (Signed)
Heidi Lewis, Heidi Lewis                 ACCOUNT NO.:  [id number]  MEDICAL RECORD NO.:  [id number]  LOCATION:  1856                         FACILITY:  [facility]  PHYSICIAN:  Lind Guest. Ninfa Linden, M.D.DATE OF BIRTH:  [birth year]  DATE OF PROCEDURE:  05/06/2014 DATE OF DISCHARGE:                              OPERATIVE REPORT   PREOPERATIVE DIAGNOSIS:  Left knee bicondylar tibial plateau fracture.  POSTOPERATIVE DIAGNOSIS:  Left knee bicondylar tibial plateau fracture.  PROCEDURE:  Open reduction and internal fixation of left knee, bicondylar tibial plateau fracture.  IMPLANTS:  Smith and Nephew 3.5 mm proximal tibial periarticular locking plate.  SURGEON:  Lind Guest. Ninfa Linden, M.D.  ASSISTANT:  Erskine Emery, PA-C  ANESTHESIA: 1. Left leg femoral nerve block. 2. General.  TOURNIQUET TIME:  Less than 1 hour.  BLOOD LOSS:  Less than 100 mL.  COMPLICATIONS:  None.  INDICATIONS:  Heidi Lewis is a very pleasant 58 year old female who sustained a mechanical fall over a chair at work on I believe, July 28. She was found have a tibial plateau fracture which is bicondylar plateau fracture.  Due to the unstable nature of this fracture, once her soft tissue swelling subsided, we recommended open reduction and internal fixation.  She understands the risks and benefits of this and the need to proceed with surgery given the instability of this periarticular fracture.  She understands the risks of acute blood loss anemia, nerve or vessel injury, fracture, infection, and DVT.  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  She understands the goals are hopefully improved alignment of the knee and improved mechanics of the knee.  PROCEDURE DESCRIPTION:  After informed consent was obtained, appropriate left knee was marked.  Anesthesia was obtained, regional femoral nerve block.  She was then brought to the operating room, placed supine on the operating table.   General anesthesia was then obtained.  A Foley catheter was placed.  A nonsterile tourniquet was placed around her upper left leg.  Time-out was called to identify correct patient, correct left knee.  I then used an Esmarch to wrap out the leg and tourniquet was inflated to 300 mm of pressure.  We then made a curvilinear incision at the level of the proximal tibia laterally and dissected down easily to the fracture.  Posterolaterally, you could see just a little bit of the fracture under visualization.  We are able to place small screw driver up into the fracture fragment to reduce the joint line.  We then chose a Smith and Nephew periarticular proximal tibial plate and secured this with bicortical screws and locking screws. This was all done under direct visualization and under direct fluoroscopy.  Once we are pleased with the reduction, we then copiously irrigated the soft tissues with normal saline solution.  Reapproximated tissue over the plate with interrupted 0 nylon suture followed by 2-0 nylon subcutaneous tissue, and staples on the skin.  Xeroform and well- padded sterile dressing was applied.  The knee was placed in knee immobilizer.  The tourniquet was let down.  The toes pinked nicely.  She was awakened, extubated, and taken to recovery room in stable condition. All final counts were correct.  There were no complications noted.  Of note, Carney Bern, PA-C, assisted in the entire case and his assistance was crucial for assisting with holding fracture fragments reduced while we placed plate and later closure of the wound.     Lind Guest. Ninfa Linden, M.D.     CYB/MEDQ  D:  05/06/2014  T:  05/06/2014  Job:  [id number]

## 2014-05-07 LAB — CBC
HEMATOCRIT: 31.9 % — AB (ref 36.0–46.0)
Hemoglobin: 10.5 g/dL — ABNORMAL LOW (ref 12.0–15.0)
MCH: 26.3 pg (ref 26.0–34.0)
MCHC: 32.9 g/dL (ref 30.0–36.0)
MCV: 79.9 fL (ref 78.0–100.0)
Platelets: 290 10*3/uL (ref 150–400)
RBC: 3.99 MIL/uL (ref 3.87–5.11)
RDW: 14.2 % (ref 11.5–15.5)
WBC: 11.4 10*3/uL — AB (ref 4.0–10.5)

## 2014-05-07 LAB — GLUCOSE, CAPILLARY
GLUCOSE-CAPILLARY: 94 mg/dL (ref 70–99)
GLUCOSE-CAPILLARY: 99 mg/dL (ref 70–99)
GLUCOSE-CAPILLARY: 97 mg/dL (ref 70–99)
Glucose-Capillary: 115 mg/dL — ABNORMAL HIGH (ref 70–99)

## 2014-05-07 LAB — PROTIME-INR
INR: 1.03 (ref 0.00–1.49)
Prothrombin Time: 13.5 seconds (ref 11.6–15.2)

## 2014-05-07 MED ORDER — LIP MEDEX EX OINT
TOPICAL_OINTMENT | CUTANEOUS | Status: AC
Start: 2014-05-07 — End: 2014-05-07
  Administered 2014-05-07: 1
  Filled 2014-05-07: qty 7

## 2014-05-07 MED ORDER — WARFARIN SODIUM 7.5 MG PO TABS
7.5000 mg | ORAL_TABLET | Freq: Once | ORAL | Status: AC
  Administered 2014-05-07: 7.5 mg via ORAL
  Filled 2014-05-07: qty 1

## 2014-05-07 NOTE — Evaluation (Signed)
Occupational Therapy Evaluation Patient Details Name: Heidi Lewis MRN: [id number] DOB: 11-02-1955 Today's Date: 05/07/2014    History of Present Illness L tib plateau fx   Clinical Impression   Pt limited by pain but tolerated up to 3in1 for toilet transfer practice. She needed min cues for safety with hand placement and overall safety with walker and to go slower. She will have family assist available at d/c. Will benefit from acute OT to progress ADL independence and provide caregiver education for d/c home with family.    Follow Up Recommendations  No OT follow up;Supervision/Assistance - 24 hour    Equipment Recommendations  3 in 1 bedside comode    Recommendations for Other Services       Precautions / Restrictions Precautions Precautions: Fall Required Braces or Orthoses: Knee Immobilizer - Left Knee Immobilizer - Left: On at all times Restrictions Weight Bearing Restrictions: Yes Other Position/Activity Restrictions: NWB L LE      Mobility Bed Mobility Overal bed mobility: Needs Assistance Bed Mobility: Supine to Sit;Sit to Supine     Supine to sit: Min assist Sit to supine: Min assist   General bed mobility comments: pt requesting to support L LE additionally to herself supporting due to pain.  Transfers Overall transfer level: Needs assistance Equipment used: Rolling walker (2 wheeled) Transfers: Sit to/from Stand Sit to Stand: Min assist         General transfer comment: cues for hand placement.    Balance                                            ADL Overall ADL's : Needs assistance/impaired Eating/Feeding: Independent;Sitting   Grooming: Wash/dry hands;Minimal assistance;Standing   Upper Body Bathing: Set up;Sitting   Lower Body Bathing: Moderate assistance;Sit to/from stand   Upper Body Dressing : Set up;Sitting   Lower Body Dressing: Moderate assistance;Sit to/from stand   Toilet Transfer: Minimal  assistance;Ambulation;BSC;RW   Toileting- Clothing Manipulation and Hygiene: Minimal assistance;Sit to/from stand         General ADL Comments: Pt states family has been assisting with LB self care. Introduced AE options but pt states she will have help at home with this. Will be sponge bathing for awhile. Practiced up to 3in1 and pt moves quickly and needs cues to slow down at times especially with turns. She attempted to stand sideways at sink and reach to wash hands so educated pt on safety with turning to face the sink with her walker before she lets go of walker.      Vision                     Perception     Praxis      Pertinent Vitals/Pain Pain Assessment: 0-10 Pain Score: 8  Pain Descriptors / Indicators: Aching Pain Intervention(s): Repositioned;Ice applied     Hand Dominance     Extremity/Trunk Assessment Upper Extremity Assessment Upper Extremity Assessment: Overall WFL for tasks assessed           Communication Communication Communication: No difficulties   Cognition Arousal/Alertness: Awake/alert Behavior During Therapy: WFL for tasks assessed/performed Overall Cognitive Status: Within Functional Limits for tasks assessed                     General Comments       Exercises  Shoulder Instructions      Home Living Family/patient expects to be discharged to:: Private residence Living Arrangements: Spouse/significant other Available Help at Discharge: Family Type of Home: House Home Access: Stairs to enter Technical brewer of Steps: 4 Entrance Stairs-Rails: [place]: One level         Biochemist, clinical: [place]: Environmental consultant - 2 wheels;Crutches          Prior Functioning/Environment Level of Independence: Independent;Independent with assistive device(s)        Comments: since injury, has needed assist with LB dressing /bathing.    OT Diagnosis: Generalized weakness;Acute  pain   OT Problem List: Decreased strength;Decreased knowledge of use of DME or AE;Pain   OT Treatment/Interventions: Self-care/ADL training;Patient/family education;Therapeutic activities;DME and/or AE instruction    OT Goals(Current goals can be found in the care plan section) Acute Rehab OT Goals Patient Stated Goal: Resume previous lifestyle asap OT Goal Formulation: With patient Time For Goal Achievement: 05/14/14 Potential to Achieve Goals: Good  OT Frequency: Min 2X/week   Barriers to D/C:            Co-evaluation              End of Session Equipment Utilized During Treatment: Gait belt;Rolling walker;Left knee immobilizer  Activity Tolerance: Patient limited by pain Patient left: in bed;with call bell/phone within reach;with family/visitor present   Time: 1255-1319 OT Time Calculation (min): 24 min Charges:  OT General Charges $OT Visit: 1 Procedure OT Evaluation $Initial OT Evaluation Tier I: 1 Procedure OT Treatments $Therapeutic Activity: 8-22 mins G-Codes: OT G-codes **NOT FOR INPATIENT CLASS** Functional Assessment Tool Used: clinical judgement Functional Limitation: Self care Self Care Current Status (U3149): At least 20 percent but less than 40 percent impaired, limited or restricted Self Care Goal Status (F0263): At least 1 percent but less than 20 percent impaired, limited or restricted  Jules Schick [phone] 05/07/2014, 1:55 PM

## 2014-05-07 NOTE — Progress Notes (Signed)
Physical Therapy Treatment Patient Details Name: Heidi Lewis MRN: [id number] DOB: Feb 03, 1956 Today's Date: 05/07/2014    History of Present Illness L tib plateau fx    PT Comments    POD # 1 pm session.  Pt c/o increased pain having just finished her OT session.  Assisted OOB to amb short distance to wheelchair to be transported to stairs.  Pt was only able to ascend one step using B crutches.  MAX c/o pain and fatigue.  Returned to room then assisted back to bed.  Applied ICE.  Follow Up Recommendations  No PT follow up     Equipment Recommendations       Recommendations for Other Services       Precautions / Restrictions Precautions Precautions: Fall Precaution Comments: L bledsoe brace Required Braces or Orthoses: Knee Immobilizer - Left Knee Immobilizer - Left: On at all times Restrictions Weight Bearing Restrictions: Yes Other Position/Activity Restrictions: NWB L LE    Mobility  Bed Mobility Overal bed mobility: Needs Assistance Bed Mobility: Supine to Sit;Sit to Supine     Supine to sit: Min assist Sit to supine: Min assist   General bed mobility comments: pt requesting to support L LE additionally to herself supporting due to pain.  Demonstrated and instructed pt how to use a belt/sheet to assist L LE on/off bed.   Also required increased time due to increased pain.  Transfers Overall transfer level: Needs assistance Equipment used: Rolling walker (2 wheeled) Transfers: Sit to/from Stand Sit to Stand: Min assist         General transfer comment: cues for hand placement. and increased time  Ambulation/Gait Ambulation/Gait assistance: Min assist Ambulation Distance (Feet): 8 Feet Assistive device: Rolling walker (2 wheeled) Gait Pattern/deviations: Step-to pattern;Trunk flexed Gait velocity: decr   General Gait Details: cues for posture and position from RW.  Decreased amb distance due to increased c/o pain.   Stairs Stairs: Yes Stairs  assistance: +2 physical assistance Stair Management: Step to pattern;With crutches;Forwards Number of Stairs: 1 General stair comments: pt was only able to complete 1/4 steps using B crutches.  Increased c/o pain and fatigue.  Will reattempt tomorrow.  Wheelchair Mobility    Modified Rankin (Stroke Patients Only)       Balance                                    Cognition Arousal/Alertness: Awake/alert Behavior During Therapy: WFL for tasks assessed/performed Overall Cognitive Status: Within Functional Limits for tasks assessed                      Exercises      General Comments General comments (skin integrity, edema, etc.): min assist in standing for ADL.      Pertinent Vitals/Pain Pain Assessment: 0-10 Pain Score: 8  Pain Descriptors / Indicators: Aching Pain Intervention(s): Repositioned;Ice applied    Home Living Family/patient expects to be discharged to:: Private residence Living Arrangements: Spouse/significant other Available Help at Discharge: Family Type of Home: House Home Access: Stairs to enter Entrance Stairs-Rails: Right;Left Home Layout: One level Home Equipment: Environmental consultant - 2 wheels;Crutches      Prior Function Level of Independence: Independent;Independent with assistive device(s)      Comments: since injury, has needed assist with LB dressing /bathing.   PT Goals (current goals can now be found in the care plan section) Acute Rehab PT  Goals Patient Stated Goal: Resume previous lifestyle asap Progress towards PT goals: Progressing toward goals    Frequency  7X/week    PT Plan      Co-evaluation             End of Session Equipment Utilized During Treatment: Gait belt;Other (comment) (L bledsoe brace) Activity Tolerance: Patient tolerated treatment well;Patient limited by pain Patient left: in bed;with call bell/phone within reach      Time: 1345-1410 PT Time Calculation (min): 25 min  Charges:  $Gait  Training: 8-22 mins $Therapeutic Activity: 8-22 mins                    G Codes:      Rica Koyanagi  PTA [facility]  Acute  Rehab Pager      [phone]

## 2014-05-07 NOTE — Progress Notes (Signed)
CARE MANAGEMENT NOTE 05/07/2014  Patient:  Heidi Lewis, Heidi Lewis   Account Number:  [id number]  Date Initiated:  05/06/2014  Documentation initiated by:  DAVIS,RHONDA  Subjective/Objective Assessment:   OPEN REDUCTION INTERNAL FIXATION (ORIF) LEFT TIBIAL PLATEAU (Left)     Action/Plan:   home when stable   Anticipated DC Date:  05/08/2014   Anticipated DC Plan:  [place]  In-house referral  NA      DC Planning Services  CM consult      [facility] Choice  NA   Choice offered to / List presented to:  C-1 Patient           Status of service:  In process, will continue to follow Medicare Important Message given?  NA - LOS <3 / Initial given by admissions (If response is "NO", the following Medicare IM given date fields will be blank) Date Medicare IM given:   Medicare IM given by:   Date Additional Medicare IM given:   Additional Medicare IM given by:    Discharge Disposition:    Per UR Regulation:  Reviewed for med. necessity/level of care/duration of stay  If discussed at [place] of Stay Meetings, dates discussed:    Comments:  [id number]: ATTENTION PATIENT IS NOT [place] CASE/Rhonda DAvis,RN,BSN,CCM. Case Jacqulyn Liner phone [phone] Claim Number-[id number]  DATE OF INJURY 04/28/2014. FAX NUMBER IS [phone] orders for hhc-physical therapy and 3 in 1 commode faxed to the above number.  [id number] Rosana Hoes, RN, BSN, CCM: CHART REVIEWED AND UPDATED.  Next chart review due on [id number]. NO DISCHARGE NEEDS PRESENT AT THIS TIME WILL CONTINUE TO FOLLOW. CASE MANAGEMENT [phone]

## 2014-05-07 NOTE — Evaluation (Signed)
Physical Therapy Evaluation Patient Details Name: Heidi Lewis MRN: [id number] DOB: 1956/05/28 Today's Date: 05/07/2014   History of Present Illness  L tib plateau fx  Clinical Impression  Pt sp; L tib plateau fx presents with decreased L LE strength/ROM, post op pain and NWB status on L LE limiting functional mobility.  Pt should progress to d/c home with family assist.    Follow Up Recommendations No PT follow up    Equipment Recommendations  None recommended by PT    Recommendations for Other Services OT consult     Precautions / Restrictions Precautions Precautions: Fall Required Braces or Orthoses: Knee Immobilizer - Left Knee Immobilizer - Left: On at all times Restrictions Weight Bearing Restrictions: Yes Other Position/Activity Restrictions: NWB      Mobility  Bed Mobility Overal bed mobility: Needs Assistance Bed Mobility: Supine to Sit     Supine to sit: Min guard     General bed mobility comments: pt self assisting L LE with UEs  Transfers Overall transfer level: Needs assistance Equipment used: Rolling walker (2 wheeled) Transfers: Sit to/from Stand Sit to Stand: Min assist         General transfer comment: cues for LE management and use of UEs to self assist  Ambulation/Gait Ambulation/Gait assistance: Min assist Ambulation Distance (Feet): 49 Feet Assistive device: Rolling walker (2 wheeled) Gait Pattern/deviations: Step-to pattern;Decreased step length - right;Decreased step length - left;Shuffle;Trunk flexed Gait velocity: decr   General Gait Details: cues for posture and position from [employer]            Wheelchair Mobility    Modified Rankin (Stroke Patients Only)       Balance                                             Pertinent Vitals/Pain Pain Assessment: 0-10 Pain Score: 8  Pain Location: L knee Pain Descriptors / Indicators: Aching Pain Intervention(s): Limited activity within patient's  tolerance;Premedicated before session;Ice applied;Patient requesting pain meds-RN notified    Home Living Family/patient expects to be discharged to:: Private residence Living Arrangements: Spouse/significant other Available Help at Discharge: Family Type of Home: House Home Access: Stairs to enter Entrance Stairs-Rails: Psychiatric nurse of Steps: 4 Home Layout: One level Home Equipment: Walker - 2 wheels;Crutches      Prior Function Level of Independence: Independent;Independent with assistive device(s)               Hand Dominance        Extremity/Trunk Assessment   Upper Extremity Assessment: Overall WFL for tasks assessed           Lower Extremity Assessment: LLE deficits/detail   LLE Deficits / Details: Immobilized knee in bledsoe brace  Cervical / Trunk Assessment: Normal  Communication   Communication: No difficulties  Cognition Arousal/Alertness: Awake/alert Behavior During Therapy: WFL for tasks assessed/performed Overall Cognitive Status: Within Functional Limits for tasks assessed                      General Comments      Exercises        Assessment/Plan    PT Assessment Patient needs continued PT services  PT Diagnosis Difficulty walking   PT Problem List Decreased strength;Decreased range of motion;Decreased activity tolerance;Decreased mobility;Decreased knowledge of use of DME;Pain  PT Treatment Interventions DME  instruction;Gait training;Stair training;Functional mobility training;Therapeutic activities;Therapeutic exercise;Patient/family education   PT Goals (Current goals can be found in the Care Plan section) Acute Rehab PT Goals Patient Stated Goal: Resume previous lifestyle asap PT Goal Formulation: With patient Time For Goal Achievement: 05/11/14 Potential to Achieve Goals: Good    Frequency 7X/week   Barriers to discharge        Co-evaluation               End of Session Equipment  Utilized During Treatment: Gait belt;Left knee immobilizer Activity Tolerance: Patient tolerated treatment well;Patient limited by pain Patient left: in chair;with call bell/phone within reach;with nursing/sitter in room Nurse Communication: Mobility status    Functional Assessment Tool Used: clinical judgement Functional Limitation: Mobility: Walking and moving around Mobility: Walking and Moving Around Current Status (H2197): At least 1 percent but less than 20 percent impaired, limited or restricted Mobility: Walking and Moving Around Goal Status [phone]): At least 1 percent but less than 20 percent impaired, limited or restricted    Time: 0832-0855 PT Time Calculation (min): 23 min   Charges:   PT Evaluation $Initial PT Evaluation Tier I: 1 Procedure PT Treatments $Gait Training: 8-22 mins   PT G Codes:   Functional Assessment Tool Used: clinical judgement Functional Limitation: Mobility: Walking and moving around    [facility] 05/07/2014, 12:24 PM

## 2014-05-07 NOTE — Progress Notes (Signed)
ANTICOAGULATION CONSULT NOTE - follow-up  Pharmacy Consult for Warfarin Indication: VTE prophylaxis  Allergies  Allergen Reactions  . Codeine Nausea Only    Patient Measurements: Height: 5\' 2"  (157.5 cm) Weight: 179 lb (81.194 kg) IBW/kg (Calculated) : 50.1  Vital Signs: Temp: 98.2 F (36.8 C) (08/07 0554) Temp src: Oral (08/07 0554) BP: 148/89 mmHg (08/07 0554) Pulse Rate: 80 (08/07 0554)  Labs:  Recent Labs  05/06/14 1528 05/07/14 0435  HGB  --  10.5*  HCT  --  31.9*  PLT  --  290  LABPROT 13.3 13.5  INR 1.01 1.03    Estimated Creatinine Clearance: 77.5 ml/min (by C-G formula based on Cr of 0.69).  Medications:  Prescriptions prior to admission  Medication Sig Dispense Refill  . ibuprofen (ADVIL,MOTRIN) 800 MG tablet Take 800 mg by mouth every 8 (eight) hours as needed for moderate pain.      Marland Kitchen losartan (COZAAR) 50 MG tablet Take 50 mg by mouth every morning.       . metFORMIN (GLUCOPHAGE) 500 MG tablet Take 500 mg by mouth daily with breakfast.       . methocarbamol (ROBAXIN) 500 MG tablet Take 1,000 mg by mouth every 6 (six) hours as needed for muscle spasms.       . Multiple Vitamin (MULTIVITAMIN) tablet Take 1 tablet by mouth every morning.       Marland Kitchen oxyCODONE-acetaminophen (PERCOCET/ROXICET) 5-325 MG per tablet Take 1 tablet by mouth every 4 (four) hours as needed for severe pain.      . naproxen sodium (ANAPROX) 220 MG tablet Take 440 mg by mouth 2 (two) times daily as needed (pain).       Scheduled:  . docusate sodium  100 mg Oral BID  . insulin aspart  0-15 Units Subcutaneous TID WC  . losartan  50 mg Oral q morning - 10a  . metFORMIN  500 mg Oral Q breakfast  . Warfarin - Pharmacist Dosing Inpatient   Does not apply q1800   Infusions:  . sodium chloride 30 mL/hr (05/07/14 9357)    Assessment: 35 yoF admitted 8/6 with left knee pain and known tibial plateau fracture after mechanical fall; s/p ORIF today.  AET 1340.  Pharmacy is consulted to dose  warfarin for VTE prophylaxis.   Baseline INR 1.01  Today's INR = 1.03 follow 1st dose of warfarin last pm  CBC (7/30): Hgb decreased/stable, Plt WNL  No bleeding noted  No current drug interactions, at home took ibuprofen and naproxen PRN  Goal of Therapy:  INR 2-3 Monitor platelets by anticoagulation protocol: Yes   Plan:   Warfarin 7.5mg  PO once today at 1800  Daily INR  Warfarin education prior to discharge  Doreene Eland, PharmD, BCPS.   Pager: [phone] 05/07/2014 7:50 AM

## 2014-05-07 NOTE — Discharge Instructions (Addendum)
Information on my medicine - XARELTO (Rivaroxaban)  This medication education was reviewed with me or my healthcare representative as part of my discharge preparation.  The pharmacist that spoke with me during my hospital stay was:  Julio Sicks, [facility]  Why was Xarelto prescribed for you? Xarelto was prescribed for you to reduce the risk of blood clots forming after orthopedic surgery. The medical term for these abnormal blood clots is venous thromboembolism (VTE).  What do you need to know about xarelto ? Take your Xarelto ONCE DAILY at the same time every day. You may take it either with or without food.  If you have difficulty swallowing the tablet whole, you may crush it and mix in applesauce just prior to taking your dose.  Take Xarelto exactly as prescribed by your doctor and DO NOT stop taking Xarelto without talking to the doctor who prescribed the medication.  Stopping without other VTE prevention medication to take the place of Xarelto may increase your risk of developing a clot.  After discharge, you should have regular check-up appointments with your healthcare provider that is prescribing your Xarelto.    What do you do if you miss a dose? If you miss a dose, take it as soon as you remember on the same day then continue your regularly scheduled once daily regimen the next day. Do not take two doses of Xarelto on the same day.   Important Safety Information A possible side effect of Xarelto is bleeding. You should call your healthcare provider right away if you experience any of the following:   Bleeding from an injury or your nose that does not stop.   Unusual colored urine (red or dark brown) or unusual colored stools (red or black).   Unusual bruising for unknown reasons.   A serious fall or if you hit your head (even if there is no bleeding).  Some medicines may interact with Xarelto and might increase your risk of bleeding while on Xarelto. To help avoid this,  consult your healthcare provider or pharmacist prior to using any new prescription or non-prescription medications, including herbals, vitamins, non-steroidal anti-inflammatory drugs (NSAIDs) and supplements.  This website has more information on Xarelto: [email or web address]

## 2014-05-07 NOTE — Progress Notes (Signed)
Subjective: 1 Day Post-Op Procedure(s) (LRB): OPEN REDUCTION INTERNAL FIXATION (ORIF) LEFT TIBIAL PLATEAU (Left) Patient reports pain as moderate.    Objective: Vital signs in last 24 hours: Temp:  [97.5 F (36.4 C)-98.6 F (37 C)] 97.5 F (36.4 C) (08/07 0945) Pulse Rate:  [80-107] 92 (08/07 1012) Resp:  [13-17] 16 (08/07 0945) BP: (137-168)/(83-101) 147/92 mmHg (08/07 1012) SpO2:  [97 %-100 %] 97 % (08/07 0945) Weight:  [81.194 kg (179 lb)] 81.194 kg (179 lb) (08/06 1500)  Intake/Output from previous day: 08/06 0701 - 08/07 0700 In: 3330 [P.O.:580; I.V.:2650; IV Piggyback:100] Out: 2520 [Urine:2500; Blood:20] Intake/Output this shift: Total I/O In: 240 [P.O.:240] Out: 300 [Urine:300]   Recent Labs  05/07/14 0435  HGB 10.5*    Recent Labs  05/07/14 0435  WBC 11.4*  RBC 3.99  HCT 31.9*  PLT 290   No results found for this basename: NA, K, CL, CO2, BUN, CREATININE, GLUCOSE, CALCIUM,  in the last 72 hours  Recent Labs  05/06/14 1528 05/07/14 0435  INR 1.01 1.03   Left leg: Neurovascular intact Sensation intact distally Intact pulses distally Dorsiflexion/Plantar flexion intact Incision: dressing C/D/I Compartment soft Tenderness left calf  Assessment/Plan: 1 Day Post-Op Procedure(s) (LRB): OPEN REDUCTION INTERNAL FIXATION (ORIF) LEFT TIBIAL PLATEAU (Left) Up with therapy Monitor left cal tenderness and swelling  Patient on Coumadin for DVT prophylaxis Dispo home in next 1-2 days Non wieght bearing left leg Heidi Lewis 05/07/2014, 10:24 AM

## 2014-05-08 LAB — GLUCOSE, CAPILLARY: Glucose-Capillary: 108 mg/dL — ABNORMAL HIGH (ref 70–99)

## 2014-05-08 LAB — PROTIME-INR
INR: 1.04 (ref 0.00–1.49)
Prothrombin Time: 13.6 seconds (ref 11.6–15.2)

## 2014-05-08 MED ORDER — RIVAROXABAN 10 MG PO TABS
10.0000 mg | ORAL_TABLET | Freq: Every day | ORAL | Status: DC
  Administered 2014-05-08: 10 mg via ORAL
  Filled 2014-05-08: qty 1

## 2014-05-08 MED ORDER — OXYCODONE-ACETAMINOPHEN 5-325 MG PO TABS: 1.0000 | ORAL_TABLET | ORAL | Status: DC | PRN

## 2014-05-08 MED ORDER — RIVAROXABAN (XARELTO) EDUCATION KIT FOR DVT/PE PATIENTS: PACK | Freq: Once | Status: DC

## 2014-05-08 MED ORDER — METHOCARBAMOL 500 MG PO TABS: 1000.0000 mg | ORAL_TABLET | Freq: Four times a day (QID) | ORAL | Status: DC | PRN

## 2014-05-08 MED ORDER — RIVAROXABAN 10 MG PO TABS: 10.0000 mg | ORAL_TABLET | Freq: Every day | ORAL | Status: DC

## 2014-05-08 NOTE — Progress Notes (Signed)
Discharged from floor via w/c, family with pt. No changes in assessment. Heidi Lewis  

## 2014-05-08 NOTE — Plan of Care (Signed)
Problem: Discharge Progression Outcomes Goal: Anticoagulant follow-up in place Outcome: Not Applicable Date Met:  42/37/02 xarelto

## 2014-05-08 NOTE — Progress Notes (Signed)
Patient ID: Heidi Lewis, female   DOB: 21-Sep-1956, 58 y.o.   MRN: [id number] Looks good overall.  Has cleared therapy.  I can let her go home today.  Instead of coumadin, will send her home on Xarelto instead.

## 2014-05-08 NOTE — Progress Notes (Signed)
PT Cancellation Note  Patient Details Name: Heidi Lewis MRN: [id number] DOB: 1956-04-03   Cancelled Treatment:     PT attempted this am.  Pt declined 2* pain level.  Pt states she prefers not to attempt stairs this am and that she has been managing them at home NWB with crutch and rail and minimal difficulty.     Kristina Bertone 05/08/2014, 12:10 PM

## 2014-05-08 NOTE — Progress Notes (Signed)
CARE MANAGEMENT NOTE 05/08/2014  Patient:  Heidi Lewis, Heidi Lewis   Account Number:  [id number]  Date Initiated:  05/06/2014  Documentation initiated by:  DAVIS,RHONDA  Subjective/Objective Assessment:   OPEN REDUCTION INTERNAL FIXATION (ORIF) LEFT TIBIAL PLATEAU (Left)     Action/Plan:   home when stable   Anticipated DC Date:  05/08/2014   Anticipated DC Plan:  [place]  In-house referral  NA      DC Planning Services  CM consult      [facility] Choice  NA   Choice offered to / List presented to:  C-1 Patient           Status of service:  Completed, signed off Medicare Important Message given?  NA - LOS <3 / Initial given by admissions (If response is "NO", the following Medicare IM given date fields will be blank) Date Medicare IM given:   Medicare IM given by:   Date Additional Medicare IM given:   Additional Medicare IM given by:    Discharge Disposition:  [place]  Per UR Regulation:  Reviewed for med. necessity/level of care/duration of stay  If discussed at [place] of Stay Meetings, dates discussed:    Comments:  05/08/2014 1200 NCM spoke to pt and made aware of [facility] agency that will follow up for [facility]. Also they will deliver 3n1 to home. Pt stated she had RW at home. Husband and family to assist her at home. Jonnie Finner RN Case Mmgt phone [phone]  05/08/2014 1030 NCM received call from [facility] with Worker's Comp # fax [phone] or phone # [phone]. Requested HP, DME, HH orders, PT/OT notes, Op notes and dc summary be faxed to number. Rep will arrange [facility] with [employer] with soc on Monday, 05/09/2014. Will deliver 3n1 to the home. Faxed requested paperwork. Jonnie Finner RN CCM Case Mgmt phone [phone]   [id number]: ATTENTION PATIENT IS NOT [employer] THIS IS A WORKER'S COMPENSATION CASE/Rhonda DAvis,RN,BSN,CCM. Case Jacqulyn Liner phone [phone] Claim Number-[id number]  DATE OF INJURY 04/28/2014. FAX NUMBER IS  [phone] orders for hhc-physical therapy and 3 in 1 commode faxed to the above number.  [id number] Rosana Hoes, RN, BSN, CCM: CHART REVIEWED AND UPDATED.  Next chart review due on [id number]. NO DISCHARGE NEEDS PRESENT AT THIS TIME WILL CONTINUE TO FOLLOW. CASE MANAGEMENT [phone]

## 2014-05-08 NOTE — Discharge Summary (Signed)
Patient ID: Heidi Lewis MRN: [id number] DOB/AGE: Feb 26, 1956 58 y.o.  Admit date: 05/06/2014 Discharge date: 05/08/2014  Admission Diagnoses:  Principal Problem:   Closed fracture of left tibial plateau Active Problems:   Tibial plateau fracture   Discharge Diagnoses:  Same  Past Medical History  Diagnosis Date  . Diabetes mellitus   . Hemorrhoid   . Vaginitis, atrophic   . Heart murmur   . Headache(784.0)   . Hypertension     dr  Revonda Humphrey    Surgeries: Procedure(s): OPEN REDUCTION INTERNAL FIXATION (ORIF) LEFT TIBIAL PLATEAU on 05/06/2014   Consultants:    Discharged Condition: Improved  Hospital Course: Heidi Lewis is an 58 y.o. female who was admitted 05/06/2014 for operative treatment ofClosed fracture of left tibial plateau. Patient has severe unremitting pain that affects sleep, daily activities, and work/hobbies. After pre-op clearance the patient was taken to the operating room on 05/06/2014 and underwent  Procedure(s): OPEN REDUCTION INTERNAL FIXATION (ORIF) LEFT TIBIAL PLATEAU.    Patient was given perioperative antibiotics: Anti-infectives   Start     Dose/Rate Route Frequency Ordered Stop   05/06/14 1800  ceFAZolin (ANCEF) IVPB 1 g/50 mL premix     1 g 100 mL/hr over 30 Minutes Intravenous Every 6 hours 05/06/14 1454 05/07/14 0659   05/06/14 0832  ceFAZolin (ANCEF) IVPB 2 g/50 mL premix     2 g 100 mL/hr over 30 Minutes Intravenous On call to O.R. 05/06/14 0092 05/06/14 1215       Patient was given sequential compression devices, early ambulation, and chemoprophylaxis to prevent DVT.  Patient benefited maximally from hospital stay and there were no complications.    Recent vital signs: Patient Vitals for the past 24 hrs:  BP Temp Temp src Pulse Resp SpO2  05/08/14 0550 142/88 mmHg 98.9 F (37.2 C) Oral 101 17 100 %  05/07/14 2037 153/98 mmHg 98.8 F (37.1 C) Oral 97 17 100 %  05/07/14 1429 162/91 mmHg 98.4 F (36.9 C) Oral 91 16 99 %  05/07/14 1012  147/92 mmHg - - 92 - -  05/07/14 0945 149/96 mmHg 97.5 F (36.4 C) Axillary 107 16 97 %     Recent laboratory studies:  Recent Labs  05/07/14 0435 05/08/14 0524  WBC 11.4*  --   HGB 10.5*  --   HCT 31.9*  --   PLT 290  --   INR 1.03 1.04     Discharge Medications:     Medication List    STOP taking these medications       ibuprofen 800 MG tablet  Commonly known as:  ADVIL,MOTRIN      TAKE these medications       losartan 50 MG tablet  Commonly known as:  COZAAR  Take 50 mg by mouth every morning.     metFORMIN 500 MG tablet  Commonly known as:  GLUCOPHAGE  Take 500 mg by mouth daily with breakfast.     methocarbamol 500 MG tablet  Commonly known as:  ROBAXIN  Take 2 tablets (1,000 mg total) by mouth every 6 (six) hours as needed for muscle spasms.     multivitamin tablet  Take 1 tablet by mouth every morning.     naproxen sodium 220 MG tablet  Commonly known as:  ANAPROX  Take 440 mg by mouth 2 (two) times daily as needed (pain).     oxyCODONE-acetaminophen 5-325 MG per tablet  Commonly known as:  PERCOCET/ROXICET  Take 1-2 tablets  by mouth every 4 (four) hours as needed for severe pain.     rivaroxaban 10 MG Tabs tablet  Commonly known as:  XARELTO  Take 1 tablet (10 mg total) by mouth daily.        Diagnostic Studies: Dg Chest 2 View  04/29/2014   CLINICAL DATA:  Preop knee repair  EXAM: CHEST  2 VIEW  COMPARISON:  10/12/2012  FINDINGS: The heart size and mediastinal contours are within normal limits. Both lungs are clear. The visualized skeletal structures are unremarkable.  IMPRESSION: No active cardiopulmonary disease.   Electronically Signed   By: Kathreen Devoid   On: 04/29/2014 16:30   Dg Hip Complete Left  04/27/2014   CLINICAL DATA:  Left hip pain after fall.  EXAM: LEFT HIP - COMPLETE 2+ VIEW  COMPARISON:  None.  FINDINGS: There is no evidence of hip fracture or dislocation. There is no evidence of arthropathy or other focal bone abnormality.   IMPRESSION: Normal left hip.   Electronically Signed   By: Sabino Dick M.D.   On: 04/27/2014 14:03   Dg Ankle Complete Left  04/27/2014   CLINICAL DATA:  Fall.  EXAM: LEFT ANKLE COMPLETE - 3+ VIEW  COMPARISON:  None.  FINDINGS: Diffuse osteopenia. No evidence of fracture or dislocation. No significant soft tissue swelling.  IMPRESSION: Diffuse mild osteopenia.  No acute abnormality identified.   Electronically Signed   By: Marcello Moores  Register   On: 04/27/2014 14:03   Ct Knee Left Wo Contrast  04/27/2014   CLINICAL DATA:  Status post fall with a left tibial plateau fracture. Left knee pain.  EXAM: CT OF THE LEFT KNEE WITHOUT CONTRAST  TECHNIQUE: Multidetector CT imaging of the left knee was performed according to the standard protocol. Multiplanar CT image reconstructions were also generated.  COMPARISON:  Plain films left knee earlier this same day.  FINDINGS: The patient has fractures of both the medial and lateral tibial plateaus. The medial tibial plateau fracture is oblique in orientation extending from the central aspect of the tibial plateau in a posterior and inferior orientation through the posterior metaphysis 3.1 cm below the articular surface without depression or displacement. The fracture involves both the medial and lateral tibial eminences with mild comminution.  The lateral tibial plateau is divided into 2 main fragments. The posterior fragment is depressed and angulated and measures approximately 2.4 cm AP by 2.9 cm transverse. The fracture extends from the central aspect of the lateral tibial plateau in a posterior and inferior orientation to a point approximately 3.4 cm below the articular surface. Depression measures 0.4 cm at the anterior margin of this fracture fragment 1.1 cm at its posterior margin.  No other fracture is identified. Lipohemarthrosis is noted. As visualized by CT scan, the cruciate and collateral ligament complexes appear intact. The posterior horn of the lateral meniscus  is detached from the tibia.  IMPRESSION: Fractures of the medial and lateral tibial plateaus with no depression or displacement of the medial tibial plateau and depression and angulation of the posterior lateral tibial plateau as described above. Associated lipohemarthrosis is noted.   Electronically Signed   By: Inge Rise M.D.   On: 04/27/2014 15:13   Dg Knee Complete 4 Views Left  04/27/2014   CLINICAL DATA:  Fall.  Knee pain.  EXAM: LEFT KNEE - COMPLETE 4+ VIEW  COMPARISON:  None.  FINDINGS: There is a comminuted bicondylar tibial plateau fracture with depression of the lateral tibial plateau. The knee is flexed  on the lateral view, but there is almost certainly a lipohemarthrosis present. The distal femur and patella appear intact. The fracture extends into the proximal tibial metaphysis. There does not appear to be metaphyseal diaphyseal dissociation.  IMPRESSION: Tibial plateau fracture with depression of the lateral condyle, most compatible with Schatzker 5 fracture. Noncontrast CT with 3 dimensional reconstructions is suggested to further characterize for fixation planning.   Electronically Signed   By: Dereck Ligas M.D.   On: 04/27/2014 14:02   Dg Knee Left Port  05/06/2014   CLINICAL DATA:  Postop surgery for left tibial plateau fracture  EXAM: PORTABLE LEFT KNEE - 1-2 VIEW  COMPARISON:  Intraoperative C-arm spot films of 05/06/2014 and preoperative left knee films of 04/27/2014  FINDINGS: Plate and screw fixation has been placed for fixation of the lateral tibial plateau fracture. Very little depression of bone fragments is seen. There is some air in the joint space as well as fluid postoperatively.  IMPRESSION: Plate and screw fixation of lateral tibial plateau fracture.   Electronically Signed   By: Ivar Drape M.D.   On: 05/06/2014 14:21   Dg Foot Complete Left  04/27/2014   CLINICAL DATA:  FALL  EXAM: LEFT FOOT - COMPLETE 3+ VIEW  COMPARISON:  None.  FINDINGS: There is no evidence of  fracture or dislocation. Plantar calcaneal enthesophyte noted. No other significant degenerative changes. Soft tissues are unremarkable.  IMPRESSION: Negative.   Electronically Signed   By: Jeannine Boga M.D.   On: 04/27/2014 14:03   Dg C-arm 1-60 Min-no Report  05/06/2014   CLINICAL DATA:  Open reduction internal fixation  EXAM: DG C-ARM 1-60 MIN - NRPT [facility]; LEFT KNEE - 3 VIEW  COMPARISON:  CT left knee April 27, 2014  FINDINGS: Frontal, oblique, and lateral views were obtained. There is screw and plate fixation for a depressed lateral tibial plateau fracture. Alignment in the lateral tibial plateau region no appears essentially anatomic. No new fracture. No dislocation.  IMPRESSION: Open reduction internal fixation for lateral tibial plateau depression fracture with alignment virtually anatomic currently.   Electronically Signed   By: Lowella Grip M.D.   On: 05/06/2014 13:40   Dg Knee 2 Views Left  05/06/2014   CLINICAL DATA:  Open reduction internal fixation  EXAM: DG C-ARM 1-60 MIN - [facility]; LEFT KNEE - 3 VIEW  COMPARISON:  CT left knee April 27, 2014  FINDINGS: Frontal, oblique, and lateral views were obtained. There is screw and plate fixation for a depressed lateral tibial plateau fracture. Alignment in the lateral tibial plateau region no appears essentially anatomic. No new fracture. No dislocation.  IMPRESSION: Open reduction internal fixation for lateral tibial plateau depression fracture with alignment virtually anatomic currently.   Electronically Signed   By: Lowella Grip M.D.   On: 05/06/2014 13:40    Disposition: 01-Home or Self Care      Discharge Instructions   Call MD / Call 911    Complete by:  As directed   If you experience chest pain or shortness of breath, CALL 911 and be transported to the hospital emergency room.  If you develope a fever above 101 F, pus (white drainage) or increased drainage or redness at the wound, or calf pain, call your surgeon's office.      Constipation Prevention    Complete by:  As directed   Drink plenty of fluids.  Prune juice may be helpful.  You may use a stool softener, such as Colace (  over the counter) 100 mg twice a day.  Use MiraLax (over the counter) for constipation as needed.     Diet - low sodium heart healthy    Complete by:  As directed      Discharge instructions    Complete by:  As directed   Increase activities as comfort allows. No weight at all on your left leg. Must wear the knee brace when up; does not need to sleep in the knee brace. Take an over-the-counter stool softener twice daily (Colace) You can get your current dressing wet in the shower. You can remove your current dressing 05/11/14 and start getting your actual incision wet daily at that standpoint.     Discharge patient    Complete by:  As directed      Increase activity slowly as tolerated    Complete by:  As directed            Follow-up Information   Follow up with Mcarthur Rossetti, MD In 2 weeks.   Specialty:  Orthopedic Surgery   Contact information:   [place] [address] [phone]        Signed: Mcarthur Rossetti 05/08/2014, 9:40 AM

## 2014-07-12 ENCOUNTER — Other Ambulatory Visit (HOSPITAL_COMMUNITY)
Admission: RE | Admit: 2014-07-12 | Discharge: 2014-07-12 | Disposition: A | Discharge status: Home / Self Care | Payer: BC Managed Care – PPO | Source: Ambulatory Visit | Attending: Internal Medicine | Admitting: Internal Medicine

## 2014-07-12 ENCOUNTER — Other Ambulatory Visit: Payer: Self-pay | Admitting: Internal Medicine

## 2014-07-12 DIAGNOSIS — Z1151 Encounter for screening for human papillomavirus (HPV): Secondary | ICD-10-CM | POA: Insufficient documentation

## 2014-07-12 DIAGNOSIS — Z01419 Encounter for gynecological examination (general) (routine) without abnormal findings: Secondary | ICD-10-CM | POA: Insufficient documentation

## 2014-07-14 LAB — CYTOLOGY - PAP

## 2014-11-23 ENCOUNTER — Encounter: Payer: Self-pay | Admitting: Gastroenterology

## 2014-11-26 ENCOUNTER — Other Ambulatory Visit (HOSPITAL_COMMUNITY): Payer: Self-pay | Admitting: Internal Medicine

## 2014-11-26 DIAGNOSIS — Z1231 Encounter for screening mammogram for malignant neoplasm of breast: Secondary | ICD-10-CM

## 2014-12-09 ENCOUNTER — Ambulatory Visit (HOSPITAL_COMMUNITY)
Admission: RE | Admit: 2014-12-09 | Discharge: 2014-12-09 | Disposition: A | Discharge status: Home / Self Care | Payer: BC Managed Care – PPO | Source: Ambulatory Visit | Attending: Internal Medicine | Admitting: Internal Medicine

## 2014-12-09 DIAGNOSIS — Z1231 Encounter for screening mammogram for malignant neoplasm of breast: Secondary | ICD-10-CM | POA: Diagnosis not present

## 2015-05-20 ENCOUNTER — Emergency Department (HOSPITAL_COMMUNITY)
Admission: EM | Admit: 2015-05-20 | Discharge: 2015-05-20 | Disposition: A | Discharge status: Home / Self Care | Payer: BC Managed Care – PPO | Attending: Emergency Medicine | Admitting: Emergency Medicine

## 2015-05-20 ENCOUNTER — Emergency Department (HOSPITAL_COMMUNITY): Payer: BC Managed Care – PPO

## 2015-05-20 DIAGNOSIS — R569 Unspecified convulsions: Secondary | ICD-10-CM

## 2015-05-20 DIAGNOSIS — Z8742 Personal history of other diseases of the female genital tract: Secondary | ICD-10-CM | POA: Insufficient documentation

## 2015-05-20 DIAGNOSIS — S0990XA Unspecified injury of head, initial encounter: Secondary | ICD-10-CM | POA: Insufficient documentation

## 2015-05-20 DIAGNOSIS — Z8719 Personal history of other diseases of the digestive system: Secondary | ICD-10-CM | POA: Insufficient documentation

## 2015-05-20 DIAGNOSIS — Z87891 Personal history of nicotine dependence: Secondary | ICD-10-CM | POA: Diagnosis not present

## 2015-05-20 DIAGNOSIS — S199XXA Unspecified injury of neck, initial encounter: Secondary | ICD-10-CM | POA: Diagnosis not present

## 2015-05-20 DIAGNOSIS — Z79899 Other long term (current) drug therapy: Secondary | ICD-10-CM | POA: Diagnosis not present

## 2015-05-20 DIAGNOSIS — R000 Tachycardia, unspecified: Secondary | ICD-10-CM | POA: Insufficient documentation

## 2015-05-20 DIAGNOSIS — Z7901 Long term (current) use of anticoagulants: Secondary | ICD-10-CM | POA: Diagnosis not present

## 2015-05-20 DIAGNOSIS — S3992XA Unspecified injury of lower back, initial encounter: Secondary | ICD-10-CM | POA: Insufficient documentation

## 2015-05-20 DIAGNOSIS — Y92410 Unspecified street and highway as the place of occurrence of the external cause: Secondary | ICD-10-CM | POA: Diagnosis not present

## 2015-05-20 DIAGNOSIS — Y998 Other external cause status: Secondary | ICD-10-CM | POA: Insufficient documentation

## 2015-05-20 DIAGNOSIS — I10 Essential (primary) hypertension: Secondary | ICD-10-CM | POA: Insufficient documentation

## 2015-05-20 DIAGNOSIS — E119 Type 2 diabetes mellitus without complications: Secondary | ICD-10-CM | POA: Diagnosis not present

## 2015-05-20 DIAGNOSIS — Y9389 Activity, other specified: Secondary | ICD-10-CM | POA: Insufficient documentation

## 2015-05-20 DIAGNOSIS — F1310 Sedative, hypnotic or anxiolytic abuse, uncomplicated: Secondary | ICD-10-CM | POA: Diagnosis not present

## 2015-05-20 DIAGNOSIS — R011 Cardiac murmur, unspecified: Secondary | ICD-10-CM | POA: Diagnosis not present

## 2015-05-20 LAB — I-STAT CHEM 8, ED
BUN: 12 mg/dL (ref 6–20)
CHLORIDE: 104 mmol/L (ref 101–111)
Calcium, Ion: 1.3 mmol/L — ABNORMAL HIGH (ref 1.12–1.23)
Creatinine, Ser: 0.6 mg/dL (ref 0.44–1.00)
Glucose, Bld: 95 mg/dL (ref 65–99)
HCT: 44 % (ref 36.0–46.0)
Hemoglobin: 15 g/dL (ref 12.0–15.0)
Potassium: 4.2 mmol/L (ref 3.5–5.1)
SODIUM: 140 mmol/L (ref 135–145)
TCO2: 22 mmol/L (ref 0–100)

## 2015-05-20 LAB — CBC WITH DIFFERENTIAL/PLATELET
BASOS PCT: 0 % (ref 0–1)
Basophils Absolute: 0 10*3/uL (ref 0.0–0.1)
EOS ABS: 0.2 10*3/uL (ref 0.0–0.7)
Eosinophils Relative: 2 % (ref 0–5)
HCT: 39.8 % (ref 36.0–46.0)
HEMOGLOBIN: 13.2 g/dL (ref 12.0–15.0)
Lymphocytes Relative: 29 % (ref 12–46)
Lymphs Abs: 2.4 10*3/uL (ref 0.7–4.0)
MCH: 26.7 pg (ref 26.0–34.0)
MCHC: 33.2 g/dL (ref 30.0–36.0)
MCV: 80.4 fL (ref 78.0–100.0)
Monocytes Absolute: 0.7 10*3/uL (ref 0.1–1.0)
Monocytes Relative: 9 % (ref 3–12)
NEUTROS PCT: 60 % (ref 43–77)
Neutro Abs: 5 10*3/uL (ref 1.7–7.7)
Platelets: 254 10*3/uL (ref 150–400)
RBC: 4.95 MIL/uL (ref 3.87–5.11)
RDW: 14 % (ref 11.5–15.5)
WBC: 8.4 10*3/uL (ref 4.0–10.5)

## 2015-05-20 LAB — RAPID URINE DRUG SCREEN, HOSP PERFORMED
Amphetamines: NOT DETECTED
Barbiturates: NOT DETECTED
Benzodiazepines: POSITIVE — AB
COCAINE: NOT DETECTED
OPIATES: NOT DETECTED
TETRAHYDROCANNABINOL: NOT DETECTED

## 2015-05-20 MED ORDER — ACETAMINOPHEN 325 MG PO TABS
650.0000 mg | ORAL_TABLET | Freq: Once | ORAL | Status: AC
  Administered 2015-05-20: 650 mg via ORAL
  Filled 2015-05-20: qty 2

## 2015-05-20 MED ORDER — HYDROCODONE-ACETAMINOPHEN 5-325 MG PO TABS
1.0000 | ORAL_TABLET | Freq: Once | ORAL | Status: AC
  Administered 2015-05-20: 1 via ORAL
  Filled 2015-05-20: qty 1

## 2015-05-20 MED ORDER — SODIUM CHLORIDE 0.9 % IV SOLN
1000.0000 mg | INTRAVENOUS | Status: AC
  Administered 2015-05-20: 1000 mg via INTRAVENOUS
  Filled 2015-05-20: qty 10

## 2015-05-20 MED ORDER — LEVETIRACETAM 500 MG PO TABS: 500.0000 mg | ORAL_TABLET | Freq: Two times a day (BID) | ORAL | Status: DC

## 2015-05-20 NOTE — Discharge Instructions (Signed)

## 2015-05-20 NOTE — ED Notes (Signed)
Patient reports that she is no longer taking blood thinners, states was when she had knee surgery 2015.
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
## 2015-05-20 NOTE — ED Notes (Signed)
Per EMS, Pt, from work, presents after several seizures.  Coworkers reported 1st episode last 35 minutes and EMS reports a 2nd episode lasted around 1 minute.  Hx of headaches, DM and HTN.  Pt reported having a seizure "many years ago" and was seen at [facility].  Pt does not take medication for seizures.  Pt given 7.5mg  Versed en route.

## 2015-05-20 NOTE — ED Notes (Signed)
Pt's money, car keys, work keys, earrings, and work ID badge given to husband at bedside.

## 2015-05-20 NOTE — ED Notes (Signed)
Pt's son came out to the desk and reported that the Pt's "head is hurting really bad."  Will notify EDP.

## 2015-05-20 NOTE — ED Notes (Signed)
Bed: RESA Expected date:  Expected time:  Means of arrival:  Comments: EMS- multiple seizures

## 2015-05-21 ENCOUNTER — Encounter (HOSPITAL_COMMUNITY): Payer: Self-pay | Admitting: Emergency Medicine

## 2015-05-21 ENCOUNTER — Emergency Department (HOSPITAL_COMMUNITY): Payer: BC Managed Care – PPO

## 2015-05-21 ENCOUNTER — Emergency Department (HOSPITAL_COMMUNITY)
Admission: EM | Admit: 2015-05-21 | Discharge: 2015-05-21 | Disposition: A | Discharge status: Home / Self Care | Payer: BC Managed Care – PPO | Attending: Emergency Medicine | Admitting: Emergency Medicine

## 2015-05-21 DIAGNOSIS — R51 Headache: Secondary | ICD-10-CM | POA: Diagnosis not present

## 2015-05-21 DIAGNOSIS — R569 Unspecified convulsions: Secondary | ICD-10-CM | POA: Diagnosis not present

## 2015-05-21 DIAGNOSIS — R011 Cardiac murmur, unspecified: Secondary | ICD-10-CM | POA: Insufficient documentation

## 2015-05-21 DIAGNOSIS — Z79899 Other long term (current) drug therapy: Secondary | ICD-10-CM | POA: Diagnosis not present

## 2015-05-21 DIAGNOSIS — Z8719 Personal history of other diseases of the digestive system: Secondary | ICD-10-CM | POA: Diagnosis not present

## 2015-05-21 DIAGNOSIS — Z87891 Personal history of nicotine dependence: Secondary | ICD-10-CM | POA: Diagnosis not present

## 2015-05-21 DIAGNOSIS — Z8742 Personal history of other diseases of the female genital tract: Secondary | ICD-10-CM | POA: Diagnosis not present

## 2015-05-21 DIAGNOSIS — E119 Type 2 diabetes mellitus without complications: Secondary | ICD-10-CM | POA: Diagnosis not present

## 2015-05-21 DIAGNOSIS — G40909 Epilepsy, unspecified, not intractable, without status epilepticus: Secondary | ICD-10-CM | POA: Diagnosis not present

## 2015-05-21 DIAGNOSIS — I10 Essential (primary) hypertension: Secondary | ICD-10-CM | POA: Insufficient documentation

## 2015-05-21 LAB — I-STAT TROPONIN, ED: TROPONIN I, POC: 0 ng/mL (ref 0.00–0.08)

## 2015-05-21 LAB — COMPREHENSIVE METABOLIC PANEL
ALT: 19 U/L (ref 14–54)
ANION GAP: 9 (ref 5–15)
AST: 22 U/L (ref 15–41)
Albumin: 3.8 g/dL (ref 3.5–5.0)
Alkaline Phosphatase: 74 U/L (ref 38–126)
BILIRUBIN TOTAL: 0.7 mg/dL (ref 0.3–1.2)
BUN: 9 mg/dL (ref 6–20)
CHLORIDE: 103 mmol/L (ref 101–111)
CO2: 25 mmol/L (ref 22–32)
Calcium: 9.9 mg/dL (ref 8.9–10.3)
Creatinine, Ser: 0.59 mg/dL (ref 0.44–1.00)
Glucose, Bld: 97 mg/dL (ref 65–99)
Potassium: 4.5 mmol/L (ref 3.5–5.1)
Sodium: 137 mmol/L (ref 135–145)
TOTAL PROTEIN: 6.6 g/dL (ref 6.5–8.1)

## 2015-05-21 LAB — CBC WITH DIFFERENTIAL/PLATELET
Basophils Absolute: 0 10*3/uL (ref 0.0–0.1)
Basophils Relative: 0 % (ref 0–1)
EOS PCT: 4 % (ref 0–5)
Eosinophils Absolute: 0.2 10*3/uL (ref 0.0–0.7)
HEMATOCRIT: 39.6 % (ref 36.0–46.0)
Hemoglobin: 13.3 g/dL (ref 12.0–15.0)
LYMPHS PCT: 29 % (ref 12–46)
Lymphs Abs: 2 10*3/uL (ref 0.7–4.0)
MCH: 26.9 pg (ref 26.0–34.0)
MCHC: 33.6 g/dL (ref 30.0–36.0)
MCV: 80 fL (ref 78.0–100.0)
MONO ABS: 0.6 10*3/uL (ref 0.1–1.0)
MONOS PCT: 8 % (ref 3–12)
NEUTROS ABS: 3.9 10*3/uL (ref 1.7–7.7)
Neutrophils Relative %: 59 % (ref 43–77)
Platelets: 232 10*3/uL (ref 150–400)
RBC: 4.95 MIL/uL (ref 3.87–5.11)
RDW: 14 % (ref 11.5–15.5)
WBC: 6.7 10*3/uL (ref 4.0–10.5)

## 2015-05-21 MED ORDER — ACETAMINOPHEN 325 MG PO TABS
650.0000 mg | ORAL_TABLET | Freq: Once | ORAL | Status: AC
  Administered 2015-05-21: 650 mg via ORAL
  Filled 2015-05-21: qty 2

## 2015-05-21 MED ORDER — ACETAMINOPHEN 325 MG PO TABS: 650.0000 mg | ORAL_TABLET | Freq: Once | ORAL | Status: DC

## 2015-05-21 MED ORDER — SODIUM CHLORIDE 0.9 % IV SOLN
1000.0000 mg | Freq: Once | INTRAVENOUS | Status: AC
  Administered 2015-05-21: 1000 mg via INTRAVENOUS
  Filled 2015-05-21: qty 10

## 2015-05-21 NOTE — ED Notes (Signed)
Patient transported to MRI 

## 2015-05-21 NOTE — ED Notes (Signed)
PA Kirichenko reports pt can eat. Meal tray ordered for pt.
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
## 2015-05-21 NOTE — ED Notes (Addendum)
Patient at MRI 

## 2015-05-21 NOTE — ED Notes (Signed)
Patient left at this time with all belongings. Given instructions to start keppra at 750 MG and follow up with neurology.

## 2015-05-21 NOTE — ED Notes (Signed)
Pt reports tylenol did not help headache. Pt has not had any seizures since triage.

## 2015-05-21 NOTE — ED Provider Notes (Signed)
Patient seen by Dr. Doy Mince an MRI was within normal limits. Patient will be given a second gram of Keppra and dose increased to 750 and she will follow up with neurology  Blanchie Dessert, MD 05/21/15 1904
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
## 2015-05-21 NOTE — Discharge Instructions (Signed)
Increase your keppra to 750mg  (1 and 1/2 tab) two times a day until you see neurology.  No driving until cleared. Seizure, Adult A seizure is abnormal electrical activity in the brain. Seizures usually last from 30 seconds to 2 minutes. There are various types of seizures. Before a seizure, you may have a warning sensation (aura) that a seizure is about to occur. An aura may include the following symptoms:   Fear or anxiety.  Nausea.  Feeling like the room is spinning (vertigo).  Vision changes, such as seeing flashing lights or spots. Common symptoms during a seizure include:  A change in attention or behavior (altered mental status).  Convulsions with rhythmic jerking movements.  Drooling.  Rapid eye movements.  Grunting.  Loss of bladder and bowel control.  Bitter taste in the mouth.  Tongue biting. After a seizure, you may feel confused and sleepy. You may also have an injury resulting from convulsions during the seizure. HOME CARE INSTRUCTIONS   If you are given medicines, take them exactly as prescribed by your health care provider.  Keep all follow-up appointments as directed by your health care provider.  Do not swim or drive or engage in risky activity during which a seizure could cause further injury to you or others until your health care provider says it is OK.  Get adequate rest.  Teach friends and family what to do if you have a seizure. They should:  Lay you on the ground to prevent a fall.  Put a cushion under your head.  Loosen any tight clothing around your neck.  Turn you on your side. If vomiting occurs, this helps keep your airway clear.  Stay with you until you recover.  Know whether or not you need emergency care. SEEK IMMEDIATE MEDICAL CARE IF:  The seizure lasts longer than 5 minutes.  The seizure is severe or you do not wake up immediately after the seizure.  You have an altered mental status after the seizure.  You are having  more frequent or worsening seizures. Someone should drive you to the emergency department or call local emergency services (911 in [place] MAKE SURE YOU:  Understand these instructions.  Will watch your condition.  Will get help right away if you are not doing well or get worse. Document Released: 09/14/2000 Document Revised: 07/08/2013 Document Reviewed: 04/29/2013 [facility] Patient Information 2015 [place], [place]. This information is not intended to replace advice given to you by your health care provider. Make sure you discuss any questions you have with your health care provider.

## 2015-05-21 NOTE — ED Notes (Signed)
Patient undressed, in gown, on monitor, continuous pulse oximetry and blood pressure cuff; seizure pads placed on bedrails

## 2015-05-24 ENCOUNTER — Emergency Department (HOSPITAL_COMMUNITY)
Admission: EM | Admit: 2015-05-24 | Discharge: 2015-05-24 | Disposition: A | Discharge status: Home / Self Care | Payer: BC Managed Care – PPO | Attending: Physician Assistant | Admitting: Physician Assistant

## 2015-05-24 ENCOUNTER — Emergency Department (HOSPITAL_COMMUNITY): Payer: BC Managed Care – PPO

## 2015-05-24 ENCOUNTER — Encounter (HOSPITAL_COMMUNITY): Payer: Self-pay | Admitting: Emergency Medicine

## 2015-05-24 DIAGNOSIS — Z8742 Personal history of other diseases of the female genital tract: Secondary | ICD-10-CM | POA: Insufficient documentation

## 2015-05-24 DIAGNOSIS — R569 Unspecified convulsions: Secondary | ICD-10-CM | POA: Diagnosis not present

## 2015-05-24 DIAGNOSIS — R011 Cardiac murmur, unspecified: Secondary | ICD-10-CM | POA: Insufficient documentation

## 2015-05-24 DIAGNOSIS — R51 Headache: Secondary | ICD-10-CM | POA: Insufficient documentation

## 2015-05-24 DIAGNOSIS — E119 Type 2 diabetes mellitus without complications: Secondary | ICD-10-CM | POA: Diagnosis not present

## 2015-05-24 DIAGNOSIS — I10 Essential (primary) hypertension: Secondary | ICD-10-CM | POA: Insufficient documentation

## 2015-05-24 DIAGNOSIS — Z8719 Personal history of other diseases of the digestive system: Secondary | ICD-10-CM | POA: Diagnosis not present

## 2015-05-24 DIAGNOSIS — Z87891 Personal history of nicotine dependence: Secondary | ICD-10-CM | POA: Diagnosis not present

## 2015-05-24 DIAGNOSIS — Z79899 Other long term (current) drug therapy: Secondary | ICD-10-CM | POA: Insufficient documentation

## 2015-05-24 LAB — URINALYSIS, ROUTINE W REFLEX MICROSCOPIC
BILIRUBIN URINE: NEGATIVE
Glucose, UA: NEGATIVE mg/dL
HGB URINE DIPSTICK: NEGATIVE
Ketones, ur: NEGATIVE mg/dL
Nitrite: NEGATIVE
PROTEIN: NEGATIVE mg/dL
Specific Gravity, Urine: 1.014 (ref 1.005–1.030)
UROBILINOGEN UA: 0.2 mg/dL (ref 0.0–1.0)
pH: 5.5 (ref 5.0–8.0)

## 2015-05-24 LAB — CBC WITH DIFFERENTIAL/PLATELET
BASOS ABS: 0 10*3/uL (ref 0.0–0.1)
Basophils Relative: 0 % (ref 0–1)
Eosinophils Absolute: 0.3 10*3/uL (ref 0.0–0.7)
Eosinophils Relative: 4 % (ref 0–5)
HEMATOCRIT: 41.2 % (ref 36.0–46.0)
Hemoglobin: 13.8 g/dL (ref 12.0–15.0)
LYMPHS ABS: 2.4 10*3/uL (ref 0.7–4.0)
LYMPHS PCT: 31 % (ref 12–46)
MCH: 26.8 pg (ref 26.0–34.0)
MCHC: 33.5 g/dL (ref 30.0–36.0)
MCV: 80.2 fL (ref 78.0–100.0)
MONO ABS: 0.8 10*3/uL (ref 0.1–1.0)
Monocytes Relative: 10 % (ref 3–12)
NEUTROS ABS: 4.4 10*3/uL (ref 1.7–7.7)
Neutrophils Relative %: 55 % (ref 43–77)
Platelets: 242 10*3/uL (ref 150–400)
RBC: 5.14 MIL/uL — AB (ref 3.87–5.11)
RDW: 13.9 % (ref 11.5–15.5)
WBC: 7.9 10*3/uL (ref 4.0–10.5)

## 2015-05-24 LAB — BASIC METABOLIC PANEL
ANION GAP: 7 (ref 5–15)
BUN: 12 mg/dL (ref 6–20)
CHLORIDE: 106 mmol/L (ref 101–111)
CO2: 28 mmol/L (ref 22–32)
Calcium: 10.7 mg/dL — ABNORMAL HIGH (ref 8.9–10.3)
Creatinine, Ser: 0.68 mg/dL (ref 0.44–1.00)
GFR calc Af Amer: >60 mL/min (ref >60)
GLUCOSE: 115 mg/dL — AB (ref 65–99)
POTASSIUM: 4.1 mmol/L (ref 3.5–5.1)
Sodium: 141 mmol/L (ref 135–145)

## 2015-05-24 LAB — URINE MICROSCOPIC-ADD ON

## 2015-05-24 LAB — I-STAT CG4 LACTIC ACID, ED: Lactic Acid, Venous: 1.97 mmol/L (ref 0.5–2.0)

## 2015-05-24 MED ORDER — IBUPROFEN 800 MG PO TABS
800.0000 mg | ORAL_TABLET | Freq: Once | ORAL | Status: AC
  Administered 2015-05-24: 800 mg via ORAL
  Filled 2015-05-24: qty 1
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
## 2015-05-24 NOTE — ED Notes (Signed)
Bed: WA02 Expected date:  Expected time:  Means of arrival:  Comments: EMS-SZ

## 2015-05-24 NOTE — ED Notes (Signed)
Patient was alert, oriented and stable upon discharge. RN went over AVS and patient had no further questions.  

## 2015-05-24 NOTE — ED Notes (Signed)
Pt had episode of shaking while NT was getting patient hooked up to monitor and getting vital signs. Legs were shaking but pt was breathing the throughout the episode and was able to turn and look at RN after speaking to her.

## 2015-05-24 NOTE — Discharge Instructions (Signed)
Please follow-up with your regular physician tomorrow. Please follow up with her neurologist as planned next week. Please continue to take Keppra as prescribed. Nonepileptic Seizures Nonepileptic seizures are seizures that are not caused by abnormal electrical signals in your brain. These seizures often seem like epileptic seizures, but they are not caused by epilepsy.  There are two types of nonepileptic seizures:  A physiologic nonepileptic seizure results from a disruption in your brain.  A psychogenic seizure results from emotional stress. These seizures are sometimes called pseudoseizures. CAUSES  Causes of physiologic nonepileptic seizures include:   Sudden drop in blood pressure.  Low blood sugar.  Low levels of salt (sodium) in your blood.  Low levels of calcium in your blood.  Migraine.  Heart rhythm problems.  Sleep disorders.  Drug and alcohol abuse. Common causes of psychogenic nonepileptic seizures include:  Stress.  Emotional trauma.  Sexual or physical abuse.  Major life events, such as divorce or the death of a loved one.  Mental health disorders, including panic attack and hyperactivity disorder. SIGNS AND SYMPTOMS A nonepileptic seizure can look like an epileptic seizure, including uncontrollable shaking (convulsions), or changes in attention, behavior, or the ability to remain awake and alert. However, there are some differences. Nonepileptic seizures usually:  Do not cause physical injuries.  Start slowly.  Include crying or shrieking.  Last longer than 2 minutes.  Have a short recovery time without headache or exhaustion. DIAGNOSIS  Your health care provider can usually diagnose nonepileptic seizures after taking your medical history and giving you a physical exam. Your health care provider may want to talk to your friends or relatives who have seen you have a seizure.  You may also need to have tests to look for causes of physiologic  nonepileptic seizures. This may include an electroencephalogram (EEG), which is a test that measures electrical activity in your brain. If you have had an epileptic seizure, the results of your EEG will be abnormal. If your health care provider thinks you have had a psychogenic nonepileptic seizure, you may need to see a mental health specialist for an evaluation. TREATMENT  Treatment depends on the type and cause of your seizures.  For physiologic nonepileptic seizures, treatment is aimed at addressing the underlying condition that caused the seizures. These seizures usually stop when the underlying condition is properly treated.  Nonepileptic seizures do not respond to the seizure medicines used to treat epilepsy.  For psychogenic seizures, you may need to work with a mental health specialist. [place] care will depend on the type of nonepileptic seizures you have.   Follow all your health care provider's instructions.  Keep all your follow-up appointments. SEEK MEDICAL CARE IF: You continue to have seizures after treatment. SEEK IMMEDIATE MEDICAL CARE IF:  Your seizures change or become more frequent.  You injure yourself during a seizure.  You have one seizure after another.  You have trouble recovering from a seizure.  You have chest pain or trouble breathing. MAKE SURE YOU:  Understand these instructions.  Will watch your condition.  Will get help right away if you are not doing well or get worse. Document Released: 11/02/2005 Document Revised: 02/01/2014 Document Reviewed: 07/14/2013 [facility] Patient Information 2015 [place], [place]. This information is not intended to replace advice given to you by your health care provider. Make sure you discuss any questions you have with your health care provider.  Seizure, Adult A seizure is abnormal electrical activity in the brain. Seizures usually  last from 30 seconds to 2 minutes. There are various types of  seizures. Before a seizure, you may have a warning sensation (aura) that a seizure is about to occur. An aura may include the following symptoms:   Fear or anxiety.  Nausea.  Feeling like the room is spinning (vertigo).  Vision changes, such as seeing flashing lights or spots. Common symptoms during a seizure include:  A change in attention or behavior (altered mental status).  Convulsions with rhythmic jerking movements.  Drooling.  Rapid eye movements.  Grunting.  Loss of bladder and bowel control.  Bitter taste in the mouth.  Tongue biting. After a seizure, you may feel confused and sleepy. You may also have an injury resulting from convulsions during the seizure. HOME CARE INSTRUCTIONS   If you are given medicines, take them exactly as prescribed by your health care provider.  Keep all follow-up appointments as directed by your health care provider.  Do not swim or drive or engage in risky activity during which a seizure could cause further injury to you or others until your health care provider says it is OK.  Get adequate rest.  Teach friends and family what to do if you have a seizure. They should:  Lay you on the ground to prevent a fall.  Put a cushion under your head.  Loosen any tight clothing around your neck.  Turn you on your side. If vomiting occurs, this helps keep your airway clear.  Stay with you until you recover.  Know whether or not you need emergency care. SEEK IMMEDIATE MEDICAL CARE IF:  The seizure lasts longer than 5 minutes.  The seizure is severe or you do not wake up immediately after the seizure.  You have an altered mental status after the seizure.  You are having more frequent or worsening seizures. Someone should drive you to the emergency department or call local emergency services (911 in [place] MAKE SURE YOU:  Understand these instructions.  Will watch your condition.  Will get help right away if you are not doing  well or get worse. Document Released: 09/14/2000 Document Revised: 07/08/2013 Document Reviewed: 04/29/2013 [facility] Patient Information 2015 [place], [place]. This information is not intended to replace advice given to you by your health care provider. Make sure you discuss any questions you have with your health care provider.
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
## 2015-05-25 LAB — URINE CULTURE

## 2015-05-27 ENCOUNTER — Emergency Department (HOSPITAL_COMMUNITY)
Admission: EM | Admit: 2015-05-27 | Discharge: 2015-05-27 | Disposition: A | Discharge status: Home / Self Care | Payer: BC Managed Care – PPO | Attending: Emergency Medicine | Admitting: Emergency Medicine

## 2015-05-27 ENCOUNTER — Encounter (HOSPITAL_COMMUNITY): Payer: Self-pay | Admitting: Emergency Medicine

## 2015-05-27 DIAGNOSIS — R011 Cardiac murmur, unspecified: Secondary | ICD-10-CM | POA: Diagnosis not present

## 2015-05-27 DIAGNOSIS — Z8719 Personal history of other diseases of the digestive system: Secondary | ICD-10-CM | POA: Insufficient documentation

## 2015-05-27 DIAGNOSIS — E119 Type 2 diabetes mellitus without complications: Secondary | ICD-10-CM | POA: Diagnosis not present

## 2015-05-27 DIAGNOSIS — Z8742 Personal history of other diseases of the female genital tract: Secondary | ICD-10-CM | POA: Diagnosis not present

## 2015-05-27 DIAGNOSIS — I10 Essential (primary) hypertension: Secondary | ICD-10-CM | POA: Diagnosis not present

## 2015-05-27 DIAGNOSIS — Z87891 Personal history of nicotine dependence: Secondary | ICD-10-CM | POA: Insufficient documentation

## 2015-05-27 DIAGNOSIS — R51 Headache: Secondary | ICD-10-CM | POA: Diagnosis not present

## 2015-05-27 DIAGNOSIS — Z79899 Other long term (current) drug therapy: Secondary | ICD-10-CM | POA: Insufficient documentation

## 2015-05-27 DIAGNOSIS — G40909 Epilepsy, unspecified, not intractable, without status epilepticus: Secondary | ICD-10-CM | POA: Insufficient documentation

## 2015-05-27 DIAGNOSIS — R569 Unspecified convulsions: Secondary | ICD-10-CM

## 2015-05-27 LAB — BASIC METABOLIC PANEL
Anion gap: 5 (ref 5–15)
BUN: 13 mg/dL (ref 6–20)
CALCIUM: 10 mg/dL (ref 8.9–10.3)
CO2: 26 mmol/L (ref 22–32)
CREATININE: 0.67 mg/dL (ref 0.44–1.00)
Chloride: 110 mmol/L (ref 101–111)
GFR calc Af Amer: >60 mL/min (ref >60)
GFR calc non Af Amer: >60 mL/min (ref >60)
GLUCOSE: 105 mg/dL — AB (ref 65–99)
Potassium: 3.9 mmol/L (ref 3.5–5.1)
Sodium: 141 mmol/L (ref 135–145)

## 2015-05-27 LAB — CBC WITH DIFFERENTIAL/PLATELET
BASOS PCT: 0 % (ref 0–1)
Basophils Absolute: 0 10*3/uL (ref 0.0–0.1)
EOS PCT: 3 % (ref 0–5)
Eosinophils Absolute: 0.3 10*3/uL (ref 0.0–0.7)
HEMATOCRIT: 38.3 % (ref 36.0–46.0)
Hemoglobin: 12.6 g/dL (ref 12.0–15.0)
LYMPHS PCT: 24 % (ref 12–46)
Lymphs Abs: 2 10*3/uL (ref 0.7–4.0)
MCH: 26.5 pg (ref 26.0–34.0)
MCHC: 32.9 g/dL (ref 30.0–36.0)
MCV: 80.5 fL (ref 78.0–100.0)
MONO ABS: 0.7 10*3/uL (ref 0.1–1.0)
MONOS PCT: 9 % (ref 3–12)
NEUTROS ABS: 5.4 10*3/uL (ref 1.7–7.7)
Neutrophils Relative %: 64 % (ref 43–77)
Platelets: 232 10*3/uL (ref 150–400)
RBC: 4.76 MIL/uL (ref 3.87–5.11)
RDW: 13.8 % (ref 11.5–15.5)
WBC: 8.4 10*3/uL (ref 4.0–10.5)

## 2015-05-27 MED ORDER — OXYCODONE-ACETAMINOPHEN 5-325 MG PO TABS
1.0000 | ORAL_TABLET | Freq: Once | ORAL | Status: AC
  Administered 2015-05-27: 1 via ORAL
  Filled 2015-05-27: qty 1

## 2015-05-27 MED ORDER — LORAZEPAM 1 MG PO TABS: 1.0000 mg | ORAL_TABLET | Freq: Two times a day (BID) | ORAL | Status: DC

## 2015-05-27 MED ORDER — SODIUM CHLORIDE 0.9 % IV SOLN
500.0000 mg | Freq: Once | INTRAVENOUS | Status: AC
  Administered 2015-05-27: 500 mg via INTRAVENOUS
  Filled 2015-05-27: qty 5
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
## 2015-05-27 NOTE — ED Notes (Signed)
Seizure pads in place

## 2015-05-27 NOTE — Discharge Instructions (Signed)

## 2015-06-02 ENCOUNTER — Encounter: Payer: Self-pay | Admitting: Neurology

## 2015-06-02 ENCOUNTER — Ambulatory Visit (INDEPENDENT_AMBULATORY_CARE_PROVIDER_SITE_OTHER): Payer: Self-pay | Admitting: Neurology

## 2015-06-02 ENCOUNTER — Ambulatory Visit (INDEPENDENT_AMBULATORY_CARE_PROVIDER_SITE_OTHER): Payer: BC Managed Care – PPO | Admitting: Neurology

## 2015-06-02 VITALS — BP 124/84 | HR 88 | Ht 62.0 in | Wt 173.6 lb

## 2015-06-02 DIAGNOSIS — F449 Dissociative and conversion disorder, unspecified: Secondary | ICD-10-CM

## 2015-06-02 DIAGNOSIS — M542 Cervicalgia: Secondary | ICD-10-CM

## 2015-06-02 DIAGNOSIS — R51 Headache: Secondary | ICD-10-CM

## 2015-06-02 DIAGNOSIS — F445 Conversion disorder with seizures or convulsions: Secondary | ICD-10-CM

## 2015-06-02 DIAGNOSIS — R519 Headache, unspecified: Secondary | ICD-10-CM

## 2015-06-02 MED ORDER — TIZANIDINE HCL 4 MG PO TABS: 4.0000 mg | ORAL_TABLET | Freq: Three times a day (TID) | ORAL | Status: DC | PRN

## 2015-06-02 MED ORDER — AMITRIPTYLINE HCL 25 MG PO TABS: 25.0000 mg | ORAL_TABLET | Freq: Every day | ORAL | Status: DC

## 2015-06-02 MED ORDER — KETOROLAC TROMETHAMINE 30 MG/ML IJ SOLN
30.0000 mg | Freq: Once | INTRAMUSCULAR | Status: AC
  Administered 2015-06-02: 30 mg via INTRAMUSCULAR
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
## 2015-06-02 NOTE — Patient Instructions (Addendum)
1. Routine EEG today 2. The seizure witnessed in the office today was a stress seizure. Treating with anti-epileptic medication such as Keppra, will not help with this type of seizure. No need to take this medication anymore. The best treatment for this type of seizure is called Cognitive Behavioral Therapy, which helps [place] cope with internal stressors so they do not present in a physical way. 3. Start amitriptyline 25mg  at bedtime for headaches. Monitor for sleepiness. 4. Take muscle relaxant Tizanidine 4mg  at bedtime for neck pain. This can be taken up to every 8 hours, monitor for drowsiness. 5. Follow-up in 1 month
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
## 2015-06-13 ENCOUNTER — Telehealth: Payer: Self-pay | Admitting: *Deleted

## 2015-06-13 NOTE — Telephone Encounter (Signed)
Patient checking on the status her referral to PT and on some forms she needed for work  call back number [phone]

## 2015-06-14 ENCOUNTER — Telehealth: Payer: Self-pay | Admitting: *Deleted

## 2015-06-14 ENCOUNTER — Telehealth: Payer: Self-pay | Admitting: Neurology

## 2015-06-14 NOTE — Telephone Encounter (Signed)
Left msg for Kennyth Lose to return my call.

## 2015-06-14 NOTE — Telephone Encounter (Signed)
Heidi Lewis, from Dr. Jiles Harold office/ called for a Behavior Health referral/ Call back @ [phone]

## 2015-06-14 NOTE — Telephone Encounter (Signed)
I spoke with her. I didn't see in her note that she was to have PT, is she supposed to have this? Do you have a form for her?

## 2015-06-14 NOTE — Telephone Encounter (Signed)
Pt called to check if FMLA documents are ready/ call back @ [phone]

## 2015-06-14 NOTE — Telephone Encounter (Signed)
Patient called to check on the status of her disability forms and her referral for ?????? (patient not sure  Who she is needing to see she states it was mentioned at her last office visit) patient also confused about some other medication she is suppose to be taking with her pain medication. Please advise Call back number [phone]

## 2015-06-14 NOTE — Telephone Encounter (Signed)
Did she mean cognitive behavioral therapy? Has she seen Psych yet? If she still has neck pain, can refer for PT for neck pain. Thanks

## 2015-06-15 NOTE — Telephone Encounter (Signed)
Pt wants to know the status please call back [phone]

## 2015-06-15 NOTE — Telephone Encounter (Signed)
Patient meant behavioral therapy. She will be referred to [facility].

## 2015-06-15 NOTE — Telephone Encounter (Signed)
See other phone note

## 2015-06-15 NOTE — Telephone Encounter (Signed)
I spoke with patient this morning. FMLA forms are ready she can come pick them up, did let her know about $25.00 form fee. Told her I would send referral today for [facility] on [address] for psychiatry evaluation.

## 2015-06-16 ENCOUNTER — Telehealth: Payer: Self-pay | Admitting: Neurology

## 2015-06-16 NOTE — Telephone Encounter (Signed)
I returned call. Explained to her that per [place] driving law she can't drive until she has been 6 months seizure free from her most recent seizure.

## 2015-06-16 NOTE — Telephone Encounter (Signed)
Pt called and wanted to know if it's ok for her to drive with the assistance of her mother being in the car with her/Dawn CB# [phone]

## 2015-06-24 ENCOUNTER — Ambulatory Visit (INDEPENDENT_AMBULATORY_CARE_PROVIDER_SITE_OTHER): Payer: Self-pay | Admitting: Psychiatry

## 2015-06-24 ENCOUNTER — Telehealth (HOSPITAL_COMMUNITY): Payer: Self-pay

## 2015-06-24 ENCOUNTER — Encounter (HOSPITAL_COMMUNITY): Payer: Self-pay | Admitting: Psychiatry

## 2015-06-24 VITALS — BP 137/90 | HR 100 | Ht 62.0 in | Wt 174.0 lb

## 2015-06-24 DIAGNOSIS — F4311 Post-traumatic stress disorder, acute: Secondary | ICD-10-CM | POA: Insufficient documentation

## 2015-06-24 DIAGNOSIS — F411 Generalized anxiety disorder: Secondary | ICD-10-CM | POA: Insufficient documentation

## 2015-06-24 MED ORDER — CITALOPRAM HYDROBROMIDE 20 MG PO TABS: 20.0000 mg | ORAL_TABLET | Freq: Every day | ORAL | Status: DC

## 2015-06-24 NOTE — Telephone Encounter (Signed)
Telephone message left for patient to inform the [facility] Leave paperwork she had left for Dr. Salem Senate to complete was prepared but there was places she needed to sign and complete before the paperwork would be finished.  Requested patient call us back or come in to complete needed paperwork so we could assist her with having turned in as requested.  Left message for patient to call back on 06/27/15 to arrange a time for her to return to finish parts on FMLA paperwork needed by patient.
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
## 2015-07-01 ENCOUNTER — Telehealth (HOSPITAL_COMMUNITY): Payer: Self-pay

## 2015-07-01 NOTE — Telephone Encounter (Signed)
07/01/15 - Late Note: 06/30/15 Patient called and stated that she is not coming back because she can't afford it and she also requested that the form for Family Medical Leave - Certification Form from [facility] be destroyed - patient also spoke with Dr. Salem Senate.Marland KitchenMariana Kaufman

## 2015-07-05 ENCOUNTER — Encounter: Payer: Self-pay | Admitting: Neurology

## 2015-07-05 ENCOUNTER — Telehealth: Payer: Self-pay | Admitting: Neurology

## 2015-07-05 ENCOUNTER — Ambulatory Visit (INDEPENDENT_AMBULATORY_CARE_PROVIDER_SITE_OTHER): Payer: BC Managed Care – PPO | Admitting: Neurology

## 2015-07-05 VITALS — BP 138/100 | HR 89 | Resp 16 | Ht 62.0 in | Wt 180.0 lb

## 2015-07-05 DIAGNOSIS — R519 Headache, unspecified: Secondary | ICD-10-CM

## 2015-07-05 DIAGNOSIS — M542 Cervicalgia: Secondary | ICD-10-CM

## 2015-07-05 DIAGNOSIS — R51 Headache: Secondary | ICD-10-CM | POA: Diagnosis not present

## 2015-07-05 DIAGNOSIS — F445 Conversion disorder with seizures or convulsions: Secondary | ICD-10-CM

## 2015-07-05 MED ORDER — TIZANIDINE HCL 4 MG PO TABS: 4.0000 mg | ORAL_TABLET | Freq: Three times a day (TID) | ORAL | Status: DC | PRN

## 2015-07-05 MED ORDER — AMITRIPTYLINE HCL 25 MG PO TABS: ORAL_TABLET | ORAL | Status: DC

## 2015-07-05 NOTE — Patient Instructions (Signed)
1. Increase amitriptyline 25mg : Take 1 & 1/2 tablets at night 2. Continue muscle relaxant 3. Continue Celexa for anxiety 4. May return to work tomorrow 5. As per [place] driving laws, no driving after any episode of loss of awareness or consciousness until 6 months event-free 6. Follow-up in 2 months
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
## 2015-07-05 NOTE — Telephone Encounter (Signed)
Patient needs a note for work with her restrictions on it or if she does not have any restrictions please call patient at [phone]

## 2015-07-05 NOTE — Telephone Encounter (Signed)
Work note written. Will call patient for p/u.

## 2015-07-05 NOTE — Telephone Encounter (Signed)
I spoke with patient. She states she needs a note for her job stating that she doesn't have any restrictions.

## 2015-07-13 ENCOUNTER — Encounter: Payer: Self-pay | Admitting: Gastroenterology

## 2015-07-26 ENCOUNTER — Ambulatory Visit (HOSPITAL_COMMUNITY): Payer: Self-pay | Admitting: Psychiatry

## 2015-09-01 ENCOUNTER — Ambulatory Visit (AMBULATORY_SURGERY_CENTER): Payer: Self-pay

## 2015-09-01 ENCOUNTER — Encounter: Payer: Self-pay | Admitting: Gastroenterology

## 2015-09-01 ENCOUNTER — Telehealth: Payer: Self-pay

## 2015-09-01 VITALS — Ht 61.0 in | Wt 183.0 lb

## 2015-09-01 DIAGNOSIS — Z1211 Encounter for screening for malignant neoplasm of colon: Secondary | ICD-10-CM

## 2015-09-01 MED ORDER — NA SULFATE-K SULFATE-MG SULF 17.5-3.13-1.6 GM/177ML PO SOLN: 1.0000 | Freq: Once | ORAL | Status: DC

## 2015-09-01 NOTE — Telephone Encounter (Signed)
Per CRNA, Osvaldo Angst, okay for pt to have procedure at [facility].

## 2015-09-01 NOTE — Telephone Encounter (Signed)
OK 

## 2015-09-01 NOTE — Progress Notes (Signed)
No egg or soy allergies Not on home 02 No previous anesthesia complications No diet or weight loss meds 
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
## 2015-09-01 NOTE — Telephone Encounter (Signed)
Heidi Lewis,  This pt qualifies for anesthetic care in the [facility].    Thanks,  Osvaldo Angst

## 2015-09-15 ENCOUNTER — Encounter: Payer: Self-pay | Admitting: Gastroenterology

## 2015-09-15 ENCOUNTER — Ambulatory Visit (AMBULATORY_SURGERY_CENTER): Payer: BC Managed Care – PPO | Admitting: Gastroenterology

## 2015-09-15 VITALS — BP 125/84 | HR 73 | Temp 98.2°F | Resp 27 | Ht 61.0 in | Wt 183.0 lb

## 2015-09-15 DIAGNOSIS — D125 Benign neoplasm of sigmoid colon: Secondary | ICD-10-CM | POA: Diagnosis not present

## 2015-09-15 DIAGNOSIS — Z1211 Encounter for screening for malignant neoplasm of colon: Secondary | ICD-10-CM

## 2015-09-15 DIAGNOSIS — K635 Polyp of colon: Secondary | ICD-10-CM

## 2015-09-15 LAB — GLUCOSE, CAPILLARY
GLUCOSE-CAPILLARY: 85 mg/dL (ref 65–99)
Glucose-Capillary: 87 mg/dL (ref 65–99)

## 2015-09-15 MED ORDER — SODIUM CHLORIDE 0.9 % IV SOLN: 500.0000 mL | INTRAVENOUS | Status: DC

## 2015-09-15 NOTE — Progress Notes (Signed)
Called to room to assist during endoscopic procedure.  Patient ID and intended procedure confirmed with present staff. Received instructions for my participation in the procedure from the performing physician.  

## 2015-09-15 NOTE — Patient Instructions (Signed)
YOU HAD AN ENDOSCOPIC PROCEDURE TODAY AT [facility]:   Refer to the procedure report that was given to you for any specific questions about what was found during the examination.  If the procedure report does not answer your questions, please call your gastroenterologist to clarify.  If you requested that your care partner not be given the details of your procedure findings, then the procedure report has been included in a sealed envelope for you to review at your convenience later.  YOU SHOULD EXPECT: Some feelings of bloating in the abdomen. Passage of more gas than usual.  Walking can help get rid of the air that was put into your GI tract during the procedure and reduce the bloating. If you had a lower endoscopy (such as a colonoscopy or flexible sigmoidoscopy) you may notice spotting of blood in your stool or on the toilet paper. If you underwent a bowel prep for your procedure, you may not have a normal bowel movement for a few days.  Please Note:  You might notice some irritation and congestion in your nose or some drainage.  This is from the oxygen used during your procedure.  There is no need for concern and it should clear up in a day or so.  SYMPTOMS TO REPORT IMMEDIATELY:   Following lower endoscopy (colonoscopy or flexible sigmoidoscopy):  Excessive amounts of blood in the stool  Significant tenderness or worsening of abdominal pains  Swelling of the abdomen that is new, acute  Fever of 100F or higher   For urgent or emergent issues, a gastroenterologist can be reached at any hour by calling [phone].   DIET: Your first meal following the procedure should be a small meal and then it is ok to progress to your normal diet. Heavy or fried foods are harder to digest and may make you feel nauseous or bloated.  Likewise, meals heavy in dairy and vegetables can increase bloating.  Drink plenty of fluids but you should avoid alcoholic beverages for 24 hours.  Try to  increase the fiber in your diet due to your diverticulosis.  ACTIVITY:  You should plan to take it easy for the rest of today and you should NOT DRIVE or use heavy machinery until tomorrow (because of the sedation medicines used during the test).    FOLLOW UP: Our staff will call the number listed on your records the next business day following your procedure to check on you and address any questions or concerns that you may have regarding the information given to you following your procedure. If we do not reach you, we will leave a message.  However, if you are feeling well and you are not experiencing any problems, there is no need to return our call.  We will assume that you have returned to your regular daily activities without incident.  If any biopsies were taken you will be contacted by phone or by letter within the next 1-3 weeks.  Please call us at [phone] if you have not heard about the biopsies in 3 weeks.    SIGNATURES/CONFIDENTIALITY: You and/or your care partner have signed paperwork which will be entered into your electronic medical record.  These signatures attest to the fact that that the information above on your After Visit Summary has been reviewed and is understood.  Full responsibility of the confidentiality of this discharge information lies with you and/or your care-partner.  HOLD ALL ASPIRIN AND NSAIDS (IBUPROFEN, ALEVE) FOR TWO WEEKS TO  RPEVENT BLEEDING PER DR STARK.

## 2015-09-15 NOTE — Progress Notes (Signed)
A/ox3 pleased with MAC, report to Suzanne RN 

## 2015-09-15 NOTE — Op Note (Signed)
[place]  [address] [place], [id number]   COLONOSCOPY PROCEDURE REPORT  PATIENT: Heidi, Lewis  MR#: [id number] BIRTHDATE: 05/21/1956 , 17  yrs. old GENDER: female ENDOSCOPIST: Ladene Artist, MD, [facility] PROCEDURE DATE:  09/15/2015 PROCEDURE:   Colonoscopy, screening and Colonoscopy with snare polypectomy First Screening Colonoscopy - Avg.  risk and is 50 yrs.  old or older - No.  Prior Negative Screening - Now for repeat screening. 10 or more years since last screening  History of Adenoma - Now for follow-up colonoscopy & has been > or = to 3 yrs.  N/A  Polyps removed today? Yes ASA CLASS:   Class II INDICATIONS:Screening for colonic neoplasia and Colorectal Neoplasm Risk Assessment for this procedure is average risk. MEDICATIONS: Monitored anesthesia care and Propofol 200 mg IV DESCRIPTION OF PROCEDURE:   After the risks benefits and alternatives of the procedure were thoroughly explained, informed consent was obtained.  The digital rectal exam revealed no abnormalities of the rectum.   The LB [id number]  endoscope was introduced through the anus and advanced to the cecum, which was identified by both the appendix and ileocecal valve. No adverse events experienced.   The quality of the prep was adequate (MiraLax was used)  The instrument was then slowly withdrawn as the colon was fully examined. Estimated blood loss is zero unless otherwise noted in this procedure report.    COLON FINDINGS: A sessile polyp measuring 6 mm in size was found in the sigmoid colon.  A polypectomy was performed using snare cautery.  The resection was complete, the polyp tissue was completely retrieved and sent to histology.   There was mild diverticulosis noted in the descending colon.   The examination was otherwise normal.  Retroflexed views revealed no abnormalities. The time to cecum = 4.6 Withdrawal time = 12.9   The scope was withdrawn and the procedure  completed. COMPLICATIONS: There were no immediate complications.  ENDOSCOPIC IMPRESSION: 1.   Sessile polyp in the sigmoid colon; polypectomy performed using snare cautery 2.   Mild diverticulosis in the descending colon  RECOMMENDATIONS: 1.  Hold Aspirin and all other NSAIDS for 2 weeks. 2.  Await pathology results 3.  Repeat colonoscopy in 5 years if polyp adenomatous; otherwise 10 years 4.  High fiber diet with liberal fluid intake.  eSigned:  Ladene Artist, MD, [facility] 09/15/2015 12:19 PM

## 2015-09-16 ENCOUNTER — Telehealth: Payer: Self-pay | Admitting: Emergency Medicine

## 2015-09-16 NOTE — Telephone Encounter (Signed)
  Follow up Call-  Call back number 09/15/2015  Post procedure Call Back phone  # [phone]  Permission to leave phone message Yes     Patient questions:  Do you have a fever, pain , or abdominal swelling? No. Pain Score  0 *  Have you tolerated food without any problems? Yes.    Have you been able to return to your normal activities? Yes.    Do you have any questions about your discharge instructions: Diet   No. Medications  No. Follow up visit  No.  Do you have questions or concerns about your Care? No.  Actions: * If pain score is 4 or above: No action needed, pain <4.

## 2015-09-21 ENCOUNTER — Ambulatory Visit: Payer: Self-pay | Admitting: Neurology

## 2015-09-23 ENCOUNTER — Ambulatory Visit: Payer: Self-pay | Admitting: Neurology

## 2015-09-23 ENCOUNTER — Encounter: Payer: Self-pay | Admitting: Gastroenterology

## 2015-10-27 ENCOUNTER — Ambulatory Visit (INDEPENDENT_AMBULATORY_CARE_PROVIDER_SITE_OTHER): Payer: BC Managed Care – PPO | Admitting: Neurology

## 2015-10-27 ENCOUNTER — Encounter: Payer: Self-pay | Admitting: Neurology

## 2015-10-27 VITALS — BP 142/90 | HR 80 | Ht 62.0 in | Wt 185.0 lb

## 2015-10-27 DIAGNOSIS — F445 Conversion disorder with seizures or convulsions: Secondary | ICD-10-CM

## 2015-10-27 DIAGNOSIS — R51 Headache: Secondary | ICD-10-CM | POA: Diagnosis not present

## 2015-10-27 DIAGNOSIS — R519 Headache, unspecified: Secondary | ICD-10-CM

## 2015-10-27 MED ORDER — AMITRIPTYLINE HCL 25 MG PO TABS: ORAL_TABLET | ORAL | Status: DC

## 2015-10-27 NOTE — Patient Instructions (Addendum)
1. Refills for amitriptyline have been sent. Amitriptyline 25mg  take 1 & 1/2 tablets regularly at night 2. Minimize Tylenol intake to 2-3 times a week to avoid rebound headaches 3. Follow-up in 8 months
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
## 2015-11-25 ENCOUNTER — Telehealth: Payer: Self-pay | Admitting: Neurology

## 2015-11-25 NOTE — Telephone Encounter (Signed)
Note received from after hours answering service: patient wanting to know when she can start driving again. It looks like last documented episode 06/2015. Please advise.

## 2015-11-25 NOTE — Telephone Encounter (Signed)
LM for patient to call back.

## 2015-11-25 NOTE — Telephone Encounter (Signed)
Pls let her know it is coming up, she will be 6 months event-free on March 6 and can start driving then. Thanks!

## 2015-11-29 ENCOUNTER — Telehealth: Payer: Self-pay | Admitting: Neurology

## 2015-11-29 NOTE — Telephone Encounter (Signed)
Returned call to patient. She wanted to check on her driving status again. I did explain to patient that she will be able to drive again as of March 6th, that is her 6 month mark from her previous episode.

## 2015-11-29 NOTE — Telephone Encounter (Signed)
VM-PT left a message to have a call back/Dawn CB# [phone]

## 2016-03-01 ENCOUNTER — Encounter: Payer: Self-pay | Admitting: Family Medicine

## 2016-05-18 DIAGNOSIS — M79642 Pain in left hand: Secondary | ICD-10-CM | POA: Insufficient documentation

## 2016-05-18 DIAGNOSIS — G5602 Carpal tunnel syndrome, left upper limb: Secondary | ICD-10-CM | POA: Insufficient documentation

## 2016-05-22 ENCOUNTER — Encounter (HOSPITAL_COMMUNITY): Payer: Self-pay | Admitting: Emergency Medicine

## 2016-05-22 ENCOUNTER — Emergency Department (HOSPITAL_COMMUNITY)
Admission: EM | Admit: 2016-05-22 | Discharge: 2016-05-22 | Disposition: A | Discharge status: Home / Self Care | Payer: BC Managed Care – PPO | Attending: Emergency Medicine | Admitting: Emergency Medicine

## 2016-05-22 DIAGNOSIS — Z79899 Other long term (current) drug therapy: Secondary | ICD-10-CM | POA: Diagnosis not present

## 2016-05-22 DIAGNOSIS — Z87891 Personal history of nicotine dependence: Secondary | ICD-10-CM | POA: Diagnosis not present

## 2016-05-22 DIAGNOSIS — R569 Unspecified convulsions: Secondary | ICD-10-CM | POA: Diagnosis present

## 2016-05-22 DIAGNOSIS — I10 Essential (primary) hypertension: Secondary | ICD-10-CM | POA: Diagnosis not present

## 2016-05-22 DIAGNOSIS — Z7984 Long term (current) use of oral hypoglycemic drugs: Secondary | ICD-10-CM | POA: Insufficient documentation

## 2016-05-22 DIAGNOSIS — Z7982 Long term (current) use of aspirin: Secondary | ICD-10-CM | POA: Diagnosis not present

## 2016-05-22 DIAGNOSIS — E119 Type 2 diabetes mellitus without complications: Secondary | ICD-10-CM | POA: Diagnosis not present

## 2016-05-22 LAB — BASIC METABOLIC PANEL
Anion gap: 6 (ref 5–15)
BUN: 11 mg/dL (ref 6–20)
CHLORIDE: 106 mmol/L (ref 101–111)
CO2: 25 mmol/L (ref 22–32)
CREATININE: 0.68 mg/dL (ref 0.44–1.00)
Calcium: 9.5 mg/dL (ref 8.9–10.3)
GFR calc non Af Amer: >60 mL/min (ref >60)
Glucose, Bld: 83 mg/dL (ref 65–99)
POTASSIUM: 3.9 mmol/L (ref 3.5–5.1)
SODIUM: 137 mmol/L (ref 135–145)

## 2016-05-22 LAB — URINE MICROSCOPIC-ADD ON: RBC / HPF: NONE SEEN RBC/hpf (ref 0–5)

## 2016-05-22 LAB — CBC WITH DIFFERENTIAL/PLATELET
Basophils Absolute: 0 10*3/uL (ref 0.0–0.1)
Basophils Relative: 0 %
EOS ABS: 0.5 10*3/uL (ref 0.0–0.7)
Eosinophils Relative: 5 %
HEMATOCRIT: 38.9 % (ref 36.0–46.0)
HEMOGLOBIN: 12.8 g/dL (ref 12.0–15.0)
LYMPHS ABS: 2.6 10*3/uL (ref 0.7–4.0)
LYMPHS PCT: 25 %
MCH: 26.4 pg (ref 26.0–34.0)
MCHC: 32.9 g/dL (ref 30.0–36.0)
MCV: 80.2 fL (ref 78.0–100.0)
MONOS PCT: 10 %
Monocytes Absolute: 1 10*3/uL (ref 0.1–1.0)
NEUTROS PCT: 60 %
Neutro Abs: 6.3 10*3/uL (ref 1.7–7.7)
Platelets: 280 10*3/uL (ref 150–400)
RBC: 4.85 MIL/uL (ref 3.87–5.11)
RDW: 14.4 % (ref 11.5–15.5)
WBC: 10.2 10*3/uL (ref 4.0–10.5)

## 2016-05-22 LAB — URINALYSIS, ROUTINE W REFLEX MICROSCOPIC
BILIRUBIN URINE: NEGATIVE
GLUCOSE, UA: NEGATIVE mg/dL
HGB URINE DIPSTICK: NEGATIVE
Ketones, ur: NEGATIVE mg/dL
Nitrite: NEGATIVE
PH: 7 (ref 5.0–8.0)
Protein, ur: NEGATIVE mg/dL
SPECIFIC GRAVITY, URINE: 1.015 (ref 1.005–1.030)

## 2016-05-22 LAB — RAPID URINE DRUG SCREEN, HOSP PERFORMED
AMPHETAMINES: NOT DETECTED
BARBITURATES: NOT DETECTED
Benzodiazepines: POSITIVE — AB
Cocaine: NOT DETECTED
OPIATES: NOT DETECTED
TETRAHYDROCANNABINOL: NOT DETECTED

## 2016-05-22 LAB — CBG MONITORING, ED: GLUCOSE-CAPILLARY: 80 mg/dL (ref 65–99)

## 2016-05-22 MED ORDER — LORAZEPAM 2 MG/ML IJ SOLN
INTRAMUSCULAR | Status: AC
  Filled 2016-05-22: qty 1

## 2016-05-22 MED ORDER — ACETAMINOPHEN 325 MG PO TABS
650.0000 mg | ORAL_TABLET | Freq: Once | ORAL | Status: AC
  Administered 2016-05-22: 650 mg via ORAL
  Filled 2016-05-22: qty 2

## 2016-05-22 NOTE — ED Notes (Signed)
Patient found having seizure. MD at bedside seizure  Lasted 1 min. Patient responding to verbal. Patient following commands. Patient placed on 2L due to 02 dropping during seizure. Pads at bedside.   Md request ativan be held at this time. Family at bedside
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
## 2016-05-22 NOTE — ED Notes (Signed)
Denies hitting head states she remembers being helped to the floor by a nurse. Also states that she jumped the entire procedure on her left hand and it causes her nerves to go crazy, thinks that's why she had a seizure.

## 2016-05-22 NOTE — ED Provider Notes (Signed)
[facility] DEPT Provider Note   CSN: [id number] Arrival date & time: 05/22/16  1237     History   Chief Complaint Chief Complaint  Patient presents with  . Seizures    HPI Heidi Lewis is a 60 y.o. female.  60 year old female with past medical history including type 2 diabetes mellitus, hypertension, PTSD, anxiety, conversion disorder, psychogenic non-epileptic events who presents with seizure-like activity. History obtained from husband and family members. Husband reports the patient was in her usual state of health this morning. She went to an appointment where she had testing for carpal tunnel which involved needles at her wrist. Patient reports that it was uncomfortable during the procedure and she was jumping around during the entire procedure. She stated it "causes her nerves to go crazy" and she suspects that it is why she had a seizure. After she was walking out of the office, she began having seizure-like activity and states that she remembers being helped to the floor by a nurse. She denies hitting her head. The episode occurred 2 more times, each episode lasting 1-2 minutes. She received 5 mg Versed by EMS in route. Husband states that she has not had any seizure-like activity in one year. No recent changes to her medications. No fever, vomiting, or recent illness.  LEVEL 5 CAVEAT DUE TO AMS   The history is provided by the spouse and a relative.  Seizures      Past Medical History:  Diagnosis Date  . Allergy   . Diabetes mellitus   . Headache(784.0)   . Heart murmur   . Hemorrhoid   . Hypertension    dr  Revonda Humphrey  . Seizures ([place])    Last seizure Sept 2016. Pt stated seizures controlled by meds now.  . Vaginitis, atrophic     Patient Active Problem List   Diagnosis Date Noted  . Acute posttraumatic stress disorder 06/24/2015  . GAD (generalized anxiety disorder) 06/24/2015  . 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 Chronic daily  headache 06/02/2015  . Neck pain 06/02/2015  . Closed fracture of left tibial plateau 05/06/2014  . Tibial plateau fracture 05/06/2014  . Bleeding external hemorrhoids 08/25/2012    Past Surgical History:  Procedure Laterality Date  . CESAREAN SECTION     x3  . COLONOSCOPY  05-07-2005  . EVALUATION UNDER ANESTHESIA WITH HEMORRHOIDECTOMY  09/19/2012   Procedure: EXAM UNDER ANESTHESIA WITH HEMORRHOIDECTOMY;  Surgeon: Harl Bowie, MD;  Location: [facility];  Service: General;  Laterality: N/A;  exam under anesthesia hemorrhiodectomy 1 -2 columns  . FINGER ARTHROSCOPY    . ORIF TIBIA PLATEAU Left 05/06/2014   Procedure: OPEN REDUCTION INTERNAL FIXATION (ORIF) LEFT TIBIAL PLATEAU;  Surgeon: Mcarthur Rossetti, MD;  Location: [facility];  Service: Orthopedics;  Laterality: Left;    OB History    No data available       Home Medications    Prior to Admission medications   Medication Sig Start Date End Date Taking? Authorizing Provider  acetaminophen (TYLENOL) 500 MG tablet Take 1,000 mg by mouth every 6 (six) hours as needed for moderate pain or headache.    Yes Historical Provider, MD  amitriptyline (ELAVIL) 25 MG tablet Take 1 & 1/2 tablets at night 10/27/15  Yes Cameron Sprang, MD  aspirin EC 81 MG tablet Take 81 mg by mouth daily.   Yes Historical Provider, MD  atorvastatin (LIPITOR) 10 MG tablet 10 mg daily. Take 1 tablet daily 10/04/15  Yes Historical Provider, MD  citalopram (CELEXA) 10 MG tablet Take 10 mg by mouth daily. Take 1 tablet daily 10/04/15  Yes Historical Provider, MD  gabapentin (NEURONTIN) 100 MG capsule Take 100 mg by mouth at bedtime.   Yes Historical Provider, MD  loratadine (CLARITIN) 10 MG tablet Take 10 mg by mouth daily as needed for allergies.   Yes Historical Provider, MD  losartan (COZAAR) 50 MG tablet Take 50 mg by mouth every morning.    Yes Historical Provider, MD  metFORMIN (GLUCOPHAGE) 500 MG tablet Take 500 mg by mouth daily with breakfast.    Yes  Historical Provider, MD  tiZANidine (ZANAFLEX) 4 MG tablet Take 1 tablet (4 mg total) by mouth every 8 (eight) hours as needed for muscle spasms (neck pain). 07/05/15  Yes Cameron Sprang, MD    Family History Family History  Problem Relation Age of Onset  . Heart disease Mother   . Cancer Father   . Colon cancer Neg Hx     Social History Social History  Substance Use Topics  . Smoking status: Former Smoker    Quit date: 12/25/1983  . Smokeless tobacco: Never Used  . Alcohol use No     Allergies   Codeine   Review of Systems Review of Systems  Unable to perform ROS: Mental status change  Neurological: Positive for seizures.     Physical Exam Updated Vital Signs BP 129/81 (BP Location: Right Arm)   Pulse 85   Temp 98.9 F (37.2 C) (Oral)   Resp 19   Ht 5\' 2"  (1.575 m)   Wt 180 lb (81.6 kg)   SpO2 99%   BMI 32.92 kg/m   Physical Exam  Constitutional: She appears well-developed and well-nourished. No distress.  Eyes closed but responsive to voice  HENT:  Head: Normocephalic and atraumatic.  Eyes: Conjunctivae and EOM are normal. Pupils are equal, round, and reactive to light.  Neck: Neck supple.  Cardiovascular: Regular rhythm and normal heart sounds.  Tachycardia present.   No murmur heard. Pulmonary/Chest: Effort normal and breath sounds normal. No respiratory distress.  Abdominal: Soft. Bowel sounds are normal. She exhibits no distension.  Musculoskeletal: She exhibits no edema.  Neurological: She has normal reflexes. No cranial nerve deficit. She exhibits normal muscle tone.  Eyes closed but opens to voice, able to follow commands, 5/5 strength and normal sensation x all 4 extremities  Skin: Skin is warm and dry.  Nursing note and vitals reviewed.    ED Treatments / Results  Labs (all labs ordered are listed, but only abnormal results are displayed) Labs Reviewed  URINALYSIS, ROUTINE W REFLEX MICROSCOPIC (NOT AT [facility]) - Abnormal; Notable for the  following:       Result Value   Leukocytes, UA LARGE (*)    All other components within normal limits  URINE RAPID DRUG SCREEN, HOSP PERFORMED - Abnormal; Notable for the following:    Benzodiazepines POSITIVE (*)    All other components within normal limits  URINE MICROSCOPIC-ADD ON - Abnormal; Notable for the following:    Squamous Epithelial / LPF 0-5 (*)    Bacteria, UA RARE (*)    All other components within normal limits  BASIC METABOLIC PANEL  CBC WITH DIFFERENTIAL/PLATELET  CBG MONITORING, ED    EKG  EKG Interpretation  Date/Time:  Tuesday May 22 2016 12:44:04 EDT Ventricular Rate:  97 PR Interval:    QRS Duration: 69 QT Interval:  334 QTC Calculation: 425 R Axis:  21 Text Interpretation:  Sinus rhythm Probable left atrial enlargement Low voltage, precordial leads No significant change since last tracing Confirmed by Sydelle Sherfield MD, Cela Newcom [phone]) on 05/22/2016 4:18:08 PM       Radiology No results found.  Procedures Procedures (including critical care time)  Medications Ordered in ED Medications  acetaminophen (TYLENOL) tablet 650 mg (650 mg Oral Given 05/22/16 1431)     Initial Impression / Assessment and Plan / ED Course  I have reviewed the triage vital signs and the nursing notes.  Pertinent labs that were available during my care of the patient were reviewed by me and considered in my medical decision making (see chart for details).  Clinical Course   Pt brought by EMS For seizure-like activity from clinic where she was having Carpal tunnel nerve testing. She was awake and alert on arrival and able to recall events. After she was triaged, I was called to the room for seizure-like activity. The patient's eyes were closed and she was mildly shaking, kicking her legs back and forth. The episode lasted approximately 1 minute after which it spontaneously resolved and she was able to answer my questions and follow directions. I reviewed the patient's previous  [facility] neurology visits which document psychogenic non-epileptic events based on normal EEG during events. I feel that her current episode is suggestive of pseudoseizure as she had no postictal phase and immediately returned to normal neurologic status. Obtained basic lab work above which was unremarkable. After several hours of observation, the patient remained comfortable with no further episodes. Discussed supportive care and follow-up with her neurologist. Return precautions reviewed and patient discharged in satisfactory condition.  Final Clinical Impressions(s) / ED Diagnoses   Final diagnoses:  Seizure-like activity [facility])    New Prescriptions New Prescriptions   No medications on file     Sharlett Iles, MD 05/22/16 [phone]

## 2016-05-22 NOTE — ED Notes (Signed)
Pt's CBG result:80, informed [id number]

## 2016-05-23 ENCOUNTER — Other Ambulatory Visit (HOSPITAL_COMMUNITY)
Admission: RE | Admit: 2016-05-23 | Discharge: 2016-05-23 | Disposition: A | Discharge status: Home / Self Care | Payer: BC Managed Care – PPO | Source: Ambulatory Visit | Attending: Obstetrics and Gynecology | Admitting: Obstetrics and Gynecology

## 2016-05-23 ENCOUNTER — Other Ambulatory Visit: Payer: Self-pay | Admitting: Obstetrics and Gynecology

## 2016-05-23 DIAGNOSIS — Z01419 Encounter for gynecological examination (general) (routine) without abnormal findings: Secondary | ICD-10-CM | POA: Diagnosis present

## 2016-05-24 LAB — CYTOLOGY - PAP

## 2016-06-19 ENCOUNTER — Other Ambulatory Visit: Payer: Self-pay | Admitting: Obstetrics and Gynecology

## 2016-06-19 ENCOUNTER — Encounter: Payer: Self-pay | Admitting: Neurology

## 2016-06-19 ENCOUNTER — Ambulatory Visit (INDEPENDENT_AMBULATORY_CARE_PROVIDER_SITE_OTHER): Payer: BC Managed Care – PPO | Admitting: Neurology

## 2016-06-19 VITALS — BP 148/82 | HR 83 | Temp 98.0°F | Ht 62.0 in | Wt 190.1 lb

## 2016-06-19 DIAGNOSIS — R51 Headache: Secondary | ICD-10-CM | POA: Diagnosis not present

## 2016-06-19 DIAGNOSIS — F445 Conversion disorder with seizures or convulsions: Secondary | ICD-10-CM | POA: Diagnosis not present

## 2016-06-19 DIAGNOSIS — R519 Headache, unspecified: Secondary | ICD-10-CM

## 2016-06-19 MED ORDER — AMITRIPTYLINE HCL 25 MG PO TABS: ORAL_TABLET | ORAL | 3 refills | Status: DC

## 2016-06-19 NOTE — Patient Instructions (Signed)
1. Refer to [facility] for Conversion disorder 2. Continue amitriptyline 25mg : Take 1 & 1/2 tablets at bedtime 3. As per [place] driving laws, no driving after an episode of loss of consciousness, until 6 months event-free 4. Follow-up in 3 months
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
## 2016-06-22 ENCOUNTER — Ambulatory Visit: Payer: Self-pay | Admitting: Neurology

## 2016-06-26 ENCOUNTER — Ambulatory Visit: Payer: Self-pay | Admitting: Neurology

## 2016-07-25 ENCOUNTER — Emergency Department (HOSPITAL_BASED_OUTPATIENT_CLINIC_OR_DEPARTMENT_OTHER)
Admit: 2016-07-25 | Discharge: 2016-07-25 | Disposition: A | Discharge status: Home / Self Care | Payer: BC Managed Care – PPO | Attending: Emergency Medicine | Admitting: Emergency Medicine

## 2016-07-25 ENCOUNTER — Telehealth: Payer: Self-pay

## 2016-07-25 ENCOUNTER — Emergency Department (HOSPITAL_COMMUNITY)
Admission: EM | Admit: 2016-07-25 | Discharge: 2016-07-25 | Disposition: A | Discharge status: Home / Self Care | Payer: BC Managed Care – PPO | Attending: Emergency Medicine | Admitting: Emergency Medicine

## 2016-07-25 ENCOUNTER — Encounter (HOSPITAL_COMMUNITY): Payer: Self-pay

## 2016-07-25 DIAGNOSIS — I10 Essential (primary) hypertension: Secondary | ICD-10-CM | POA: Diagnosis not present

## 2016-07-25 DIAGNOSIS — F445 Conversion disorder with seizures or convulsions: Secondary | ICD-10-CM

## 2016-07-25 DIAGNOSIS — M79609 Pain in unspecified limb: Secondary | ICD-10-CM | POA: Diagnosis not present

## 2016-07-25 DIAGNOSIS — E119 Type 2 diabetes mellitus without complications: Secondary | ICD-10-CM | POA: Diagnosis not present

## 2016-07-25 DIAGNOSIS — Z79899 Other long term (current) drug therapy: Secondary | ICD-10-CM | POA: Insufficient documentation

## 2016-07-25 DIAGNOSIS — Z7982 Long term (current) use of aspirin: Secondary | ICD-10-CM | POA: Diagnosis not present

## 2016-07-25 DIAGNOSIS — R251 Tremor, unspecified: Secondary | ICD-10-CM | POA: Diagnosis present

## 2016-07-25 DIAGNOSIS — M79662 Pain in left lower leg: Secondary | ICD-10-CM | POA: Insufficient documentation

## 2016-07-25 DIAGNOSIS — M79605 Pain in left leg: Secondary | ICD-10-CM

## 2016-07-25 DIAGNOSIS — Z87891 Personal history of nicotine dependence: Secondary | ICD-10-CM | POA: Insufficient documentation

## 2016-07-25 MED ORDER — ACETAMINOPHEN 325 MG PO TABS
650.0000 mg | ORAL_TABLET | Freq: Once | ORAL | Status: AC
  Administered 2016-07-25: 650 mg via ORAL
  Filled 2016-07-25: qty 2

## 2016-07-25 MED ORDER — IBUPROFEN 800 MG PO TABS: 800.0000 mg | ORAL_TABLET | Freq: Three times a day (TID) | ORAL | 0 refills | Status: DC

## 2016-07-25 NOTE — ED Notes (Signed)
Pt found to be shaking in her room.  Did not fall out of chair but slid to seating with staff assist. Immediately assisted back up in her chair by her standing.  Pt alert and oriented with no confusion noted.  Pt transferred to room for evaluation

## 2016-07-25 NOTE — ED Triage Notes (Signed)
Per EMS, pt from work.  Pt at meeting and having "pseudoseizure".  Pt c/o knee pain.  Pt stood up and then lied on ground.  Pt had 3 episodes of legs shaking.  Pt responsive entirely during event and follows command.  Vitals:  142/98, hr 100, cbg 106, 99% ra

## 2016-07-25 NOTE — Discharge Instructions (Signed)

## 2016-07-25 NOTE — Progress Notes (Signed)
*  Preliminary Results* Left lower extremity venous duplex completed. Left lower extremity is negative for deep vein thrombosis. There is no evidence of left Baker's cyst.  07/25/2016 11:57 AM  Maudry Mayhew, BS, RVT, RDCS, RDMS

## 2016-07-25 NOTE — ED Triage Notes (Signed)
Pt has had before. Neuro stated not a typical seizure. Cannot remember what it was called.

## 2016-07-25 NOTE — Telephone Encounter (Signed)
Patient called to inform us she leaving the ED. She states that she was in a lot of pain today while in a meeting. Afterwards she fell down and had a seizure. She has a follow-up here in Dec and wants to know if you want her seen sooner. Please advise.

## 2016-07-25 NOTE — ED Notes (Signed)
Bed: WA01 Expected date:  Expected time:  Means of arrival:  Comments: 

## 2016-07-25 NOTE — ED Provider Notes (Signed)
[facility] DEPT Provider Note   CSN: [id number] Arrival date & time: 07/25/16  1023     History   Chief Complaint Chief Complaint  Patient presents with  . Tremors    HPI Heidi Lewis is a 59 y.o. female.  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 When she was at a meeting this morning sitting down she was having increasing amounts of leg pain which is in the knee and the calf on the left, she reported standing up and as she walked she remembers falling to the ground and then having some seizure activity which she cannot recall. She did not have a postictal state, she arrives here today stating that she is no longer having any shaking or seizures or tremors but is complaining of leg pain only. She denies any risk factors for pulmonary embolism or DVT and denies any swelling of the leg  Past Medical History:  Diagnosis Date  . Allergy   . Diabetes mellitus   . Headache(784.0)   . Heart murmur   . Hemorrhoid   . Hypertension    dr  Revonda Humphrey  . Seizures ([place])    Last seizure Sept 2016. Pt stated seizures controlled by meds now.  . Vaginitis, atrophic     Patient Active Problem List   Diagnosis Date Noted  . Acute posttraumatic stress disorder 06/24/2015  . GAD (generalized anxiety disorder) 06/24/2015  . 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 Chronic daily headache 06/02/2015  . Neck pain 06/02/2015  . Closed fracture of left tibial plateau 05/06/2014  . Tibial plateau fracture 05/06/2014  . Bleeding external hemorrhoids 08/25/2012    Past Surgical History:  Procedure Laterality Date  . CESAREAN SECTION     x3  . COLONOSCOPY  05-07-2005  . EVALUATION UNDER ANESTHESIA WITH HEMORRHOIDECTOMY  09/19/2012   Procedure: EXAM UNDER ANESTHESIA WITH HEMORRHOIDECTOMY;   Surgeon: Harl Bowie, MD;  Location: [facility];  Service: General;  Laterality: N/A;  exam under anesthesia hemorrhiodectomy 1 -2 columns  . FINGER ARTHROSCOPY    . ORIF TIBIA PLATEAU Left 05/06/2014   Procedure: OPEN REDUCTION INTERNAL FIXATION (ORIF) LEFT TIBIAL PLATEAU;  Surgeon: Mcarthur Rossetti, MD;  Location: [facility];  Service: Orthopedics;  Laterality: Left;    OB History    No data available       Home Medications    Prior to Admission medications   Medication Sig Start Date End Date Taking? Authorizing Provider  acetaminophen (TYLENOL) 500 MG tablet Take 1,000 mg by mouth every 6 (six) hours as needed for moderate pain or headache.    Yes Historical Provider, MD  amitriptyline (ELAVIL) 25 MG tablet Take 1 & 1/2 tablets at night Patient taking differently: Take 50 mg by mouth at bedtime.  06/19/16  Yes Cameron Sprang, MD  aspirin EC 81 MG tablet Take 81 mg by mouth daily.   Yes Historical Provider, MD  atorvastatin (LIPITOR) 10 MG tablet 10 mg every morning. Take 1 tablet daily 10/04/15  Yes Historical Provider, MD  Chlorphen-Phenyleph-ASA (ALKA-SELTZER PLUS COLD PO) Take 1 tablet by mouth once as needed (cold symptoms.).   Yes Historical Provider, MD  citalopram (CELEXA) 10 MG tablet Take 10 mg by mouth every evening. Take 1 tablet daily 10/04/15  Yes Historical Provider, MD  clotrimazole-betamethasone (LOTRISONE) cream Apply 1 application topically as directed. As needed for rash.   Yes Historical Provider, MD  gabapentin (NEURONTIN) 100 MG capsule Take 100 mg by mouth at bedtime.   Yes Historical Provider, MD  loratadine (CLARITIN) 10 MG tablet Take 10 mg by mouth daily as needed for allergies.   Yes Historical Provider, MD  losartan (COZAAR) 50 MG tablet Take 50 mg by mouth every morning.    Yes Historical Provider, MD  metFORMIN (GLUCOPHAGE) 500 MG tablet Take 500 mg by mouth daily with breakfast.    Yes Historical Provider, MD  tiZANidine (ZANAFLEX) 4 MG tablet Take 1  tablet (4 mg total) by mouth every 8 (eight) hours as needed for muscle spasms (neck pain). 07/05/15  Yes Cameron Sprang, MD  ibuprofen (ADVIL,MOTRIN) 800 MG tablet Take 1 tablet (800 mg total) by mouth 3 (three) times daily. 07/25/16   Noemi Chapel, MD    Family History Family History  Problem Relation Age of Onset  . Heart disease Mother   . Cancer Father   . Colon cancer Neg Hx     Social History Social History  Substance Use Topics  . Smoking status: Former Smoker    Quit date: 12/25/1983  . Smokeless tobacco: Never Used  . Alcohol use No     Allergies   Codeine   Review of Systems Review of Systems  All other systems reviewed and are negative.    Physical Exam Updated Vital Signs BP 156/98 (BP Location: Right Arm)   Pulse 101   Temp 98.3 F (36.8 C) (Oral)   Resp 18   SpO2 98%   Physical Exam  Constitutional: She appears well-developed and well-nourished. No distress.  HENT:  Head: Normocephalic and atraumatic.  Mouth/Throat: Oropharynx is clear and moist. No oropharyngeal exudate.  Eyes: Conjunctivae and EOM are normal. Pupils are equal, round, and reactive to light. Right eye exhibits no discharge. Left eye exhibits no discharge. No scleral icterus.  Neck: Normal range of motion. Neck supple. No JVD present. No thyromegaly present.  Cardiovascular: Normal rate, regular rhythm, normal heart sounds and intact distal pulses.  Exam reveals no gallop and no friction rub.   No murmur heard. Pulmonary/Chest: Effort normal and breath sounds normal. No respiratory distress. She has no wheezes. She has no rales.  Abdominal: Soft. Bowel sounds are normal. She exhibits no distension and no mass. There is no tenderness.  Musculoskeletal: She exhibits tenderness. She exhibits no edema.  Mild tenderness with range of motion of the left knee, there is no effusion, supple joints, no asymmetry, no edema  Lymphadenopathy:    She has no cervical adenopathy.  Neurological: She  is alert. Coordination normal.  Clear speech, clear memory, no postictal state, no confusion, follows commands without difficulty, normal  straight leg raise bilaterally, number strength, normal finger nose finger, normal rapid alternating movements, cranial nerves III through XII appear intact   Skin: Skin is warm and dry. No rash noted. No erythema.  Psychiatric: She has a normal mood and affect. Her behavior is normal.  Nursing note and vitals reviewed.    ED Treatments / Results  Labs (all labs ordered are listed, but only abnormal results are displayed) Labs Reviewed - No data to display   Radiology No results found.  Procedures Procedures (including critical care time)  Medications Ordered in ED Medications  acetaminophen (TYLENOL) tablet 650 mg (650 mg Oral Given 07/25/16 1155)  Initial Impression / Assessment and Plan / ED Course  I have reviewed the triage vital signs and the nursing notes.  Pertinent labs & imaging results that were available during my care of the patient were reviewed by me and considered in my medical decision making (see chart for details).  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     Noemi Chapel, MD 07/25/16 [phone]

## 2016-07-26 NOTE — Telephone Encounter (Signed)
Pls let her know f/u as scheduled in Dec. Would strongly recommend she start seeing a psychiatrist and therapist again, [facility] may be an option cost-wise. No driving until 6 months event-free. Thanks

## 2016-07-26 NOTE — Telephone Encounter (Signed)
Notified patient of below. Patient states she had an appointment yesterday and today at [facility] and they didn't think there is a need for her to continue to come.

## 2016-07-27 NOTE — Telephone Encounter (Signed)
Pls ask her who she had seen, we will give them a call to clarify reason for referring to them. Thanks

## 2016-07-27 NOTE — Telephone Encounter (Signed)
Patient notified work note was ready to be picked up.

## 2016-07-27 NOTE — Telephone Encounter (Signed)
Patient states she seen a Georgiann Mccoy. She also wants to know if we can write a note for her being out of work yesterday and today due to seizure.

## 2016-07-27 NOTE — Telephone Encounter (Signed)
That is fine, pls write work note, thanks

## 2016-08-13 ENCOUNTER — Ambulatory Visit (INDEPENDENT_AMBULATORY_CARE_PROVIDER_SITE_OTHER): Payer: BC Managed Care – PPO

## 2016-08-13 ENCOUNTER — Encounter (INDEPENDENT_AMBULATORY_CARE_PROVIDER_SITE_OTHER): Payer: Self-pay | Admitting: Physician Assistant

## 2016-08-13 ENCOUNTER — Ambulatory Visit (INDEPENDENT_AMBULATORY_CARE_PROVIDER_SITE_OTHER): Payer: BC Managed Care – PPO | Admitting: Physician Assistant

## 2016-08-13 DIAGNOSIS — M25562 Pain in left knee: Secondary | ICD-10-CM

## 2016-08-13 DIAGNOSIS — M7062 Trochanteric bursitis, left hip: Secondary | ICD-10-CM | POA: Diagnosis not present

## 2016-08-13 MED ORDER — LIDOCAINE HCL 1 % IJ SOLN
1.0000 mL | INTRAMUSCULAR | Status: AC | PRN
  Administered 2016-08-13: 1 mL

## 2016-08-13 MED ORDER — METHYLPREDNISOLONE ACETATE 40 MG/ML IJ SUSP
40.0000 mg | INTRAMUSCULAR | Status: AC | PRN
  Administered 2016-08-13: 40 mg via INTRA_ARTICULAR
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
## 2016-08-27 ENCOUNTER — Ambulatory Visit (INDEPENDENT_AMBULATORY_CARE_PROVIDER_SITE_OTHER): Payer: BC Managed Care – PPO | Admitting: Physician Assistant

## 2016-08-29 ENCOUNTER — Ambulatory Visit (INDEPENDENT_AMBULATORY_CARE_PROVIDER_SITE_OTHER): Payer: Self-pay | Admitting: Orthopaedic Surgery

## 2016-08-31 NOTE — Patient Instructions (Addendum)
Your procedure is scheduled on: Thursday, 12/7  Enter through the Main Entrance of [facility] at: 10:30 am  Pick up the phone at the desk and dial [phone].  Call this number if you have problems the morning of surgery: [phone].  Remember: Do NOT eat or drink (including water) after midnight Wednesday, 12/6  Take these medicines the morning of surgery with a SIP OF WATER:  Lipitor, losartan  Do not take metformin on Thursday morning, day of surgery.  We will check your blood sugar and treat if needed.  Do NOT wear jewelry (body piercing), metal hair clips/bobby pins, make-up, or nail polish. Do NOT wear lotions, powders, or perfumes.  You may wear deoderant. Do NOT shave for 48 hours prior to surgery. Do NOT bring valuables to the hospital. Partials/bridgework may not be worn into surgery.  Have a responsible adult drive you home and stay with you for 24 hours after your procedure. Home with Husband Mikeal Hawthorne cell [phone]

## 2016-09-03 ENCOUNTER — Encounter (HOSPITAL_COMMUNITY)
Admission: RE | Admit: 2016-09-03 | Discharge: 2016-09-03 | Disposition: A | Discharge status: Home / Self Care | Payer: BC Managed Care – PPO | Source: Ambulatory Visit | Attending: Obstetrics and Gynecology | Admitting: Obstetrics and Gynecology

## 2016-09-03 ENCOUNTER — Encounter (HOSPITAL_COMMUNITY): Payer: Self-pay

## 2016-09-03 DIAGNOSIS — I10 Essential (primary) hypertension: Secondary | ICD-10-CM | POA: Diagnosis not present

## 2016-09-03 DIAGNOSIS — F411 Generalized anxiety disorder: Secondary | ICD-10-CM

## 2016-09-03 DIAGNOSIS — Z01812 Encounter for preprocedural laboratory examination: Secondary | ICD-10-CM | POA: Insufficient documentation

## 2016-09-03 DIAGNOSIS — R011 Cardiac murmur, unspecified: Secondary | ICD-10-CM | POA: Insufficient documentation

## 2016-09-03 DIAGNOSIS — Z87891 Personal history of nicotine dependence: Secondary | ICD-10-CM | POA: Diagnosis not present

## 2016-09-03 DIAGNOSIS — E1142 Type 2 diabetes mellitus with diabetic polyneuropathy: Secondary | ICD-10-CM | POA: Diagnosis not present

## 2016-09-03 DIAGNOSIS — R51 Headache: Secondary | ICD-10-CM | POA: Insufficient documentation

## 2016-09-03 DIAGNOSIS — Z7984 Long term (current) use of oral hypoglycemic drugs: Secondary | ICD-10-CM | POA: Diagnosis not present

## 2016-09-03 DIAGNOSIS — E785 Hyperlipidemia, unspecified: Secondary | ICD-10-CM | POA: Diagnosis not present

## 2016-09-03 DIAGNOSIS — Z7982 Long term (current) use of aspirin: Secondary | ICD-10-CM | POA: Diagnosis not present

## 2016-09-03 DIAGNOSIS — Z885 Allergy status to narcotic agent status: Secondary | ICD-10-CM | POA: Diagnosis not present

## 2016-09-03 DIAGNOSIS — R569 Unspecified convulsions: Secondary | ICD-10-CM

## 2016-09-03 DIAGNOSIS — Z8249 Family history of ischemic heart disease and other diseases of the circulatory system: Secondary | ICD-10-CM | POA: Diagnosis not present

## 2016-09-03 DIAGNOSIS — K644 Residual hemorrhoidal skin tags: Secondary | ICD-10-CM | POA: Insufficient documentation

## 2016-09-03 DIAGNOSIS — F419 Anxiety disorder, unspecified: Secondary | ICD-10-CM | POA: Diagnosis not present

## 2016-09-03 DIAGNOSIS — D259 Leiomyoma of uterus, unspecified: Secondary | ICD-10-CM | POA: Diagnosis not present

## 2016-09-03 DIAGNOSIS — E119 Type 2 diabetes mellitus without complications: Secondary | ICD-10-CM

## 2016-09-03 DIAGNOSIS — N760 Acute vaginitis: Secondary | ICD-10-CM | POA: Diagnosis not present

## 2016-09-03 DIAGNOSIS — N840 Polyp of corpus uteri: Secondary | ICD-10-CM | POA: Diagnosis not present

## 2016-09-03 DIAGNOSIS — N950 Postmenopausal bleeding: Secondary | ICD-10-CM | POA: Diagnosis not present

## 2016-09-03 DIAGNOSIS — Z833 Family history of diabetes mellitus: Secondary | ICD-10-CM | POA: Diagnosis not present

## 2016-09-03 DIAGNOSIS — F4310 Post-traumatic stress disorder, unspecified: Secondary | ICD-10-CM | POA: Diagnosis not present

## 2016-09-03 DIAGNOSIS — F329 Major depressive disorder, single episode, unspecified: Secondary | ICD-10-CM

## 2016-09-03 DIAGNOSIS — N952 Postmenopausal atrophic vaginitis: Secondary | ICD-10-CM | POA: Diagnosis not present

## 2016-09-03 HISTORY — DX: Hyperlipidemia, unspecified: E78.5

## 2016-09-03 HISTORY — DX: Major depressive disorder, single episode, unspecified: F32.9

## 2016-09-03 HISTORY — DX: Anxiety disorder, unspecified: F41.9

## 2016-09-03 HISTORY — DX: Depression, unspecified: F32.A

## 2016-09-03 HISTORY — DX: Myoneural disorder, unspecified: G70.9

## 2016-09-03 LAB — CBC
HEMATOCRIT: 38.4 % (ref 36.0–46.0)
HEMOGLOBIN: 13.1 g/dL (ref 12.0–15.0)
MCH: 26.9 pg (ref 26.0–34.0)
MCHC: 34.1 g/dL (ref 30.0–36.0)
MCV: 78.9 fL (ref 78.0–100.0)
Platelets: 248 10*3/uL (ref 150–400)
RBC: 4.87 MIL/uL (ref 3.87–5.11)
RDW: 14.5 % (ref 11.5–15.5)
WBC: 9.4 10*3/uL (ref 4.0–10.5)

## 2016-09-03 LAB — BASIC METABOLIC PANEL
ANION GAP: 6 (ref 5–15)
BUN: 13 mg/dL (ref 6–20)
CALCIUM: 9.8 mg/dL (ref 8.9–10.3)
CO2: 29 mmol/L (ref 22–32)
Chloride: 104 mmol/L (ref 101–111)
Creatinine, Ser: 0.74 mg/dL (ref 0.44–1.00)
GFR calc Af Amer: >60 mL/min (ref >60)
GLUCOSE: 112 mg/dL — AB (ref 65–99)
POTASSIUM: 4.3 mmol/L (ref 3.5–5.1)
Sodium: 139 mmol/L (ref 135–145)

## 2016-09-06 ENCOUNTER — Encounter (HOSPITAL_COMMUNITY): Payer: Self-pay | Admitting: *Deleted

## 2016-09-06 ENCOUNTER — Ambulatory Visit (HOSPITAL_COMMUNITY): Payer: BC Managed Care – PPO | Admitting: Registered Nurse

## 2016-09-06 ENCOUNTER — Encounter (HOSPITAL_COMMUNITY)
Admission: RE | Disposition: A | Discharge status: Home / Self Care | Payer: Self-pay | Source: Ambulatory Visit | Attending: Obstetrics and Gynecology

## 2016-09-06 ENCOUNTER — Ambulatory Visit (HOSPITAL_COMMUNITY)
Admission: RE | Admit: 2016-09-06 | Discharge: 2016-09-06 | Disposition: A | Discharge status: Home / Self Care | Payer: BC Managed Care – PPO | Source: Ambulatory Visit | Attending: Obstetrics and Gynecology | Admitting: Obstetrics and Gynecology

## 2016-09-06 DIAGNOSIS — N952 Postmenopausal atrophic vaginitis: Secondary | ICD-10-CM | POA: Insufficient documentation

## 2016-09-06 DIAGNOSIS — Z7982 Long term (current) use of aspirin: Secondary | ICD-10-CM | POA: Insufficient documentation

## 2016-09-06 DIAGNOSIS — N950 Postmenopausal bleeding: Secondary | ICD-10-CM | POA: Diagnosis not present

## 2016-09-06 DIAGNOSIS — Z7984 Long term (current) use of oral hypoglycemic drugs: Secondary | ICD-10-CM | POA: Insufficient documentation

## 2016-09-06 DIAGNOSIS — I10 Essential (primary) hypertension: Secondary | ICD-10-CM | POA: Insufficient documentation

## 2016-09-06 DIAGNOSIS — F4310 Post-traumatic stress disorder, unspecified: Secondary | ICD-10-CM | POA: Insufficient documentation

## 2016-09-06 DIAGNOSIS — N840 Polyp of corpus uteri: Secondary | ICD-10-CM | POA: Insufficient documentation

## 2016-09-06 DIAGNOSIS — F419 Anxiety disorder, unspecified: Secondary | ICD-10-CM | POA: Insufficient documentation

## 2016-09-06 DIAGNOSIS — Z87891 Personal history of nicotine dependence: Secondary | ICD-10-CM | POA: Insufficient documentation

## 2016-09-06 DIAGNOSIS — Z833 Family history of diabetes mellitus: Secondary | ICD-10-CM | POA: Insufficient documentation

## 2016-09-06 DIAGNOSIS — D259 Leiomyoma of uterus, unspecified: Secondary | ICD-10-CM | POA: Insufficient documentation

## 2016-09-06 DIAGNOSIS — E1142 Type 2 diabetes mellitus with diabetic polyneuropathy: Secondary | ICD-10-CM | POA: Insufficient documentation

## 2016-09-06 DIAGNOSIS — N760 Acute vaginitis: Secondary | ICD-10-CM | POA: Insufficient documentation

## 2016-09-06 DIAGNOSIS — Z885 Allergy status to narcotic agent status: Secondary | ICD-10-CM | POA: Insufficient documentation

## 2016-09-06 DIAGNOSIS — E785 Hyperlipidemia, unspecified: Secondary | ICD-10-CM | POA: Insufficient documentation

## 2016-09-06 DIAGNOSIS — Z8249 Family history of ischemic heart disease and other diseases of the circulatory system: Secondary | ICD-10-CM | POA: Insufficient documentation

## 2016-09-06 HISTORY — PX: HYSTEROSCOPY WITH D & C: SHX1775

## 2016-09-06 LAB — GLUCOSE, CAPILLARY
GLUCOSE-CAPILLARY: 116 mg/dL — AB (ref 65–99)
Glucose-Capillary: 112 mg/dL — ABNORMAL HIGH (ref 65–99)

## 2016-09-06 SURGERY — DILATATION AND CURETTAGE /HYSTEROSCOPY
Anesthesia: General | Site: Uterus

## 2016-09-06 MED ORDER — LIDOCAINE HCL 2 % IJ SOLN
INTRAMUSCULAR | Status: DC | PRN
  Administered 2016-09-06: 10 mL

## 2016-09-06 MED ORDER — LACTATED RINGERS IV SOLN: INTRAVENOUS | Status: DC

## 2016-09-06 MED ORDER — DEXAMETHASONE SODIUM PHOSPHATE 10 MG/ML IJ SOLN
INTRAMUSCULAR | Status: DC | PRN
  Administered 2016-09-06: 10 mg via INTRAVENOUS

## 2016-09-06 MED ORDER — HYDROMORPHONE HCL 1 MG/ML IJ SOLN
INTRAMUSCULAR | Status: AC
  Filled 2016-09-06: qty 1

## 2016-09-06 MED ORDER — LIDOCAINE 2% (20 MG/ML) 5 ML SYRINGE
INTRAMUSCULAR | Status: DC | PRN
  Administered 2016-09-06: 100 mg via INTRAVENOUS

## 2016-09-06 MED ORDER — KETOROLAC TROMETHAMINE 30 MG/ML IJ SOLN
INTRAMUSCULAR | Status: AC
  Filled 2016-09-06: qty 1

## 2016-09-06 MED ORDER — LIDOCAINE HCL (CARDIAC) 20 MG/ML IV SOLN
INTRAVENOUS | Status: AC
  Filled 2016-09-06: qty 5

## 2016-09-06 MED ORDER — KETOROLAC TROMETHAMINE 30 MG/ML IJ SOLN
INTRAMUSCULAR | Status: DC | PRN
  Administered 2016-09-06: 30 mg via INTRAVENOUS

## 2016-09-06 MED ORDER — SCOPOLAMINE 1 MG/3DAYS TD PT72
MEDICATED_PATCH | TRANSDERMAL | Status: AC
  Filled 2016-09-06: qty 1

## 2016-09-06 MED ORDER — SCOPOLAMINE 1 MG/3DAYS TD PT72
1.0000 | MEDICATED_PATCH | Freq: Once | TRANSDERMAL | Status: DC
  Administered 2016-09-06: 1.5 mg via TRANSDERMAL

## 2016-09-06 MED ORDER — ONDANSETRON HCL 4 MG/2ML IJ SOLN
INTRAMUSCULAR | Status: DC | PRN
  Administered 2016-09-06: 4 mg via INTRAVENOUS

## 2016-09-06 MED ORDER — IBUPROFEN 600 MG PO TABS: 600.0000 mg | ORAL_TABLET | Freq: Four times a day (QID) | ORAL | 0 refills | Status: AC | PRN

## 2016-09-06 MED ORDER — LIDOCAINE HCL 2 % IJ SOLN
INTRAMUSCULAR | Status: AC
  Filled 2016-09-06: qty 20

## 2016-09-06 MED ORDER — HYDROMORPHONE HCL 1 MG/ML IJ SOLN
0.2500 mg | INTRAMUSCULAR | Status: DC | PRN
  Administered 2016-09-06: 0.25 mg via INTRAVENOUS

## 2016-09-06 MED ORDER — LACTATED RINGERS IV SOLN
INTRAVENOUS | Status: DC
  Administered 2016-09-06 (×2): via INTRAVENOUS

## 2016-09-06 MED ORDER — FENTANYL CITRATE (PF) 100 MCG/2ML IJ SOLN
INTRAMUSCULAR | Status: AC
  Filled 2016-09-06: qty 2

## 2016-09-06 MED ORDER — SODIUM CHLORIDE 0.9 % IR SOLN
Status: DC | PRN
  Administered 2016-09-06: 3000 mL

## 2016-09-06 MED ORDER — PROMETHAZINE HCL 25 MG/ML IJ SOLN: 6.2500 mg | INTRAMUSCULAR | Status: DC | PRN

## 2016-09-06 MED ORDER — PROPOFOL 10 MG/ML IV BOLUS
INTRAVENOUS | Status: AC
  Filled 2016-09-06: qty 20

## 2016-09-06 MED ORDER — MIDAZOLAM HCL 2 MG/2ML IJ SOLN
INTRAMUSCULAR | Status: AC
  Filled 2016-09-06: qty 2

## 2016-09-06 MED ORDER — MIDAZOLAM HCL 5 MG/5ML IJ SOLN
INTRAMUSCULAR | Status: DC | PRN
  Administered 2016-09-06: 2 mg via INTRAVENOUS

## 2016-09-06 MED ORDER — MEPERIDINE HCL 25 MG/ML IJ SOLN
6.2500 mg | INTRAMUSCULAR | Status: DC | PRN
Start: 2016-09-06 — End: 2016-09-06

## 2016-09-06 MED ORDER — OXYCODONE HCL 5 MG PO TABS: 5.0000 mg | ORAL_TABLET | Freq: Once | ORAL | Status: DC | PRN

## 2016-09-06 MED ORDER — ONDANSETRON HCL 4 MG/2ML IJ SOLN
INTRAMUSCULAR | Status: AC
  Filled 2016-09-06: qty 2

## 2016-09-06 MED ORDER — PROPOFOL 10 MG/ML IV BOLUS
INTRAVENOUS | Status: DC | PRN
  Administered 2016-09-06: 170 mg via INTRAVENOUS

## 2016-09-06 MED ORDER — DEXAMETHASONE SODIUM PHOSPHATE 10 MG/ML IJ SOLN
INTRAMUSCULAR | Status: AC
  Filled 2016-09-06: qty 1

## 2016-09-06 MED ORDER — FENTANYL CITRATE (PF) 100 MCG/2ML IJ SOLN
INTRAMUSCULAR | Status: DC | PRN
  Administered 2016-09-06: 25 ug via INTRAVENOUS
  Administered 2016-09-06: 50 ug via INTRAVENOUS
  Administered 2016-09-06: 25 ug via INTRAVENOUS

## 2016-09-06 MED ORDER — OXYCODONE HCL 5 MG/5ML PO SOLN: 5.0000 mg | Freq: Once | ORAL | Status: DC | PRN

## 2016-09-06 SURGICAL SUPPLY — 16 items
CANISTER SUCT 3000ML (MISCELLANEOUS) ×3 IMPLANT
CATH ROBINSON RED A/P 16FR (CATHETERS) ×3 IMPLANT
CLOTH BEACON ORANGE TIMEOUT ST (SAFETY) ×3 IMPLANT
CONTAINER PREFILL 10% NBF 60ML (FORM) ×6 IMPLANT
DEVICE MYOSURE REACH (MISCELLANEOUS) ×2 IMPLANT
DILATOR CANAL MILEX (MISCELLANEOUS) ×2 IMPLANT
GLOVE BIO SURGEON STRL SZ7 (GLOVE) ×3 IMPLANT
GLOVE BIOGEL PI IND STRL 7.0 (GLOVE) ×2 IMPLANT
GLOVE BIOGEL PI INDICATOR 7.0 (GLOVE) ×4
GOWN STRL REUS W/TWL LRG LVL3 (GOWN DISPOSABLE) ×6 IMPLANT
PACK VAGINAL MINOR WOMEN LF (CUSTOM PROCEDURE TRAY) ×3 IMPLANT
PAD OB MATERNITY 4.3X12.25 (PERSONAL CARE ITEMS) ×3 IMPLANT
TOWEL OR 17X24 6PK STRL BLUE (TOWEL DISPOSABLE) ×6 IMPLANT
TUBING AQUILEX INFLOW (TUBING) ×3 IMPLANT
TUBING AQUILEX OUTFLOW (TUBING) ×3 IMPLANT
WATER STERILE IRR 1000ML POUR (IV SOLUTION) ×3 IMPLANT

## 2016-09-06 NOTE — Brief Op Note (Signed)
09/06/2016  12:51 PM  PATIENT:  Heidi Lewis  60 y.o. female  PRE-OPERATIVE DIAGNOSIS:  N95.0 PMB D25.1 Fibroid, endometrial mass  POST-OPERATIVE DIAGNOSIS:  FIBROID, Endometrial polyps, vaginal atrophy  PROCEDURE:  Procedure(s) with comments: DILATATION AND CURETTAGE /HYSTEROSCOPY  MYOSURE,MYOMECTOMY,POLYPECTOMY (N/A) - POSSIBLE MYOSURE  SURGEON:  Surgeon(s) and Role:    * Thurnell Lose, MD - Primary  PHYSICIAN ASSISTANT:   ASSISTANTS: Technician   ANESTHESIA:   local and general  EBL:  Total I/O In: 1100 [I.V.:1100] Out: 125 [Urine:100; Blood:25]  BLOOD ADMINISTERED:none  DRAINS: none   LOCAL MEDICATIONS USED:  LIDOCAINE  and Amount: 10 ml  SPECIMEN:  Source of Specimen:  Fibroid, polyps, endometrial currettings  DISPOSITION OF SPECIMEN:  PATHOLOGY  COUNTS:  YES  TOURNIQUET:  * No tourniquets in log *  DICTATION: .Other Dictation: Dictation Number [id number]  PLAN OF CARE: Discharge to home after PACU  PATIENT DISPOSITION:  PACU - hemodynamically stable.   Delay start of Pharmacological VTE agent (>24hrs) due to surgical blood loss or risk of bleeding: yes

## 2016-09-06 NOTE — Anesthesia Preprocedure Evaluation (Addendum)
Anesthesia Evaluation  Patient identified by MRN, date of birth, ID band Patient awake    Reviewed: Allergy & Precautions, NPO status , Patient's Chart, lab work & pertinent test results  Airway Mallampati: I  TM Distance: >3 FB Neck ROM: Full    Dental  (+) Upper Dentures   Pulmonary former smoker,    breath sounds clear to auscultation       Cardiovascular hypertension, Pt. on medications  Rhythm:Regular Rate:Normal     Neuro/Psych  Headaches, Seizures -,  PSYCHIATRIC DISORDERS Anxiety Depression  Neuromuscular disease    GI/Hepatic negative GI ROS, Neg liver ROS,   Endo/Other  diabetes, Type 2, Oral Hypoglycemic Agents  Renal/GU   negative genitourinary   Musculoskeletal negative musculoskeletal ROS (+)   Abdominal   Peds negative pediatric ROS (+)  Hematology negative hematology ROS (+)   Anesthesia Other Findings   Reproductive/Obstetrics negative OB ROS                            Anesthesia Physical Anesthesia Plan  ASA: II  Anesthesia Plan: General   Post-op Pain Management:    Induction: Intravenous  Airway Management Planned: LMA  Additional Equipment:   Intra-op Plan:   Post-operative Plan: Extubation in OR  Informed Consent: I have reviewed the patients History and Physical, chart, labs and discussed the procedure including the risks, benefits and alternatives for the proposed anesthesia with the patient or authorized representative who has indicated his/her understanding and acceptance.   Dental advisory given  Plan Discussed with: CRNA  Anesthesia Plan Comments:         Anesthesia Quick Evaluation

## 2016-09-06 NOTE — Interval H&P Note (Signed)
History and Physical Interval Note:  09/06/2016 11:50 AM  Heidi Lewis  has presented today for surgery, with the diagnosis of N95.0 PMB D25.1 Fibroid  The various methods of treatment have been discussed with the patient and family. After consideration of risks, benefits and other options for treatment, the patient has consented to  Procedure(s) with comments: [place] (N/A) - [place] as a surgical intervention .  The patient's history has been reviewed, patient examined, no change in status, stable for surgery.  I have reviewed the patient's chart and labs.  Questions were answered to the patient's satisfaction.     Simona Huh, Albirtha Grinage

## 2016-09-06 NOTE — Discharge Instructions (Addendum)
Post Anesthesia Home Care Instructions  Activity: Get plenty of rest for the remainder of the day. A responsible adult should stay with you for 24 hours following the procedure.  For the next 24 hours, DO NOT: -Drive a car -Paediatric nurse -Drink alcoholic beverages -Take any medication unless instructed by your physician -Make any legal decisions or sign important papers.  Meals: Start with liquid foods such as gelatin or soup. Progress to regular foods as tolerated. Avoid greasy, spicy, heavy foods. If nausea and/or vomiting occur, drink only clear liquids until the nausea and/or vomiting subsides. Call your physician if vomiting continues.  Special Instructions/Symptoms: Your throat may feel dry or sore from the anesthesia or the breathing tube placed in your throat during surgery. If this causes discomfort, gargle with warm salt water. The discomfort should disappear within 24 hours.  If you had a scopolamine patch placed behind your ear for the management of post- operative nausea and/or vomiting:  1. The medication in the patch is effective for 72 hours, after which it should be removed.  Wrap patch in a tissue and discard in the trash. Wash hands thoroughly with soap and water. 2. You may remove the patch earlier than 72 hours if you experience unpleasant side effects which may include dry mouth, dizziness or visual disturbances. 3. Avoid touching the patch. Wash your hands with soap and water after contact with the patch.   Dilation and Curettage or Vacuum Curettage, Care After This sheet gives you information about how to care for yourself after your procedure. Your health care provider may also give you more specific instructions. If you have problems or questions, contact your health care provider. What can I expect after the procedure? After your procedure, it is common to have:  Mild pain or cramping.  Some vaginal bleeding or spotting. These may last for up to 2 weeks  after your procedure. Follow these instructions at home: Activity  Do not drive or use heavy machinery while taking prescription pain medicine.  Avoid driving for the first 24 hours after your procedure.  Take frequent, short walks, followed by rest periods, throughout the day. Ask your health care provider what activities are safe for you. After 1-2 days, you may be able to return to your normal activities.  Do not lift anything heavier than 10 lb (4.5 kg) until your health care provider approves.  For at least 2 weeks, or as long as told by your health care provider, do not:  [place].  Use tampons.  Have sexual intercourse. General instructions  Take over-the-counter and prescription medicines only as told by your health care provider. This is especially important if you take blood thinning medicine.  Do not take baths, swim, or use a hot tub until your health care provider approves. Take showers instead of baths.  Wear compression stockings as told by your health care provider. These stockings help to prevent blood clots and reduce swelling in your legs.  It is your responsibility to get the results of your procedure. Ask your health care provider, or the department performing the procedure, when your results will be ready.  Keep all follow-up visits as told by your health care provider. This is important. Contact a health care provider if:  You have severe cramps that get worse or that do not get better with medicine.  You have severe abdominal pain.  You cannot drink fluids without vomiting.  You develop pain in a different area of your pelvis.  You  have bad-smelling vaginal discharge.  You have a rash. Get help right away if:  You have vaginal bleeding that soaks more than one sanitary pad in 1 hour, for 2 hours in a row.  You pass large blood clots from your vagina.  You have a fever that is above 100.32F (38.0C).  Your abdomen feels very tender or  hard.  You have chest pain.  You have shortness of breath.  You cough up blood.  You feel dizzy or light-headed.  You faint.  You have pain in your neck or shoulder area. This information is not intended to replace advice given to you by your health care provider. Make sure you discuss any questions you have with your health care provider. Document Released: 09/14/2000 Document Revised: 05/16/2016 Document Reviewed: 04/19/2016 Elsevier Interactive Patient Education  2017 [employer].

## 2016-09-06 NOTE — Transfer of Care (Signed)
Immediate Anesthesia Transfer of Care Note  Patient: Heidi Lewis  Procedure(s) Performed: Procedure(s) with comments: DILATATION AND CURETTAGE /HYSTEROSCOPY  MYOSURE,MYOMECTOMY,POLYPECTOMY (N/A) - POSSIBLE MYOSURE  Patient Location: PACU  Anesthesia Type:General  Level of Consciousness:  sedated, patient cooperative and responds to stimulation  Airway & Oxygen Therapy:Patient Spontanous Breathing and Patient connected to face mask oxgen  Post-op Assessment:  Report given to PACU RN and Post -op Vital signs reviewed and stable  Post vital signs:  Reviewed and stable  Last Vitals:  Vitals:   09/06/16 1010  BP: (!) 138/98  Pulse: 88  Resp: 16  Temp: 123XX123 C    Complications: No apparent anesthesia complications

## 2016-09-06 NOTE — Anesthesia Procedure Notes (Signed)
Procedure Name: LMA Insertion Date/Time: 09/06/2016 11:58 AM Performed by: Talbot Grumbling Pre-anesthesia Checklist: Patient identified, Emergency Drugs available, Suction available and Patient being monitored Patient Re-evaluated:Patient Re-evaluated prior to inductionOxygen Delivery Method: Circle system utilized Preoxygenation: Pre-oxygenation with 100% oxygen Intubation Type: IV induction Ventilation: Mask ventilation without difficulty LMA: LMA inserted LMA Size: 4.0 Number of attempts: 1 Placement Confirmation: positive ETCO2 and breath sounds checked- equal and bilateral Tube secured with: Tape Dental Injury: Teeth and Oropharynx as per pre-operative assessment

## 2016-09-06 NOTE — Anesthesia Postprocedure Evaluation (Signed)
Anesthesia Post Note  Patient: Heidi Lewis  Procedure(s) Performed: Procedure(s) (LRB): DILATATION AND CURETTAGE /HYSTEROSCOPY  MYOSURE,MYOMECTOMY,POLYPECTOMY (N/A)  Patient location during evaluation: PACU Anesthesia Type: General Level of consciousness: awake and alert Pain management: pain level controlled Vital Signs Assessment: post-procedure vital signs reviewed and stable Respiratory status: spontaneous breathing, nonlabored ventilation, respiratory function stable and patient connected to nasal cannula oxygen Cardiovascular status: blood pressure returned to baseline and stable Postop Assessment: no signs of nausea or vomiting Anesthetic complications: no     Last Vitals:  Vitals:   09/06/16 1300 09/06/16 1315  BP: 117/75 139/89  Pulse: 93 86  Resp: (!) 21 14  Temp:      Last Pain:  Vitals:   09/06/16 1315  TempSrc:   PainSc: 9    Pain Goal: Patients Stated Pain Goal: 3 (09/06/16 1010)               Effie Berkshire

## 2016-09-06 NOTE — H&P (Signed)
History of Present Illness  General:          60 y/o with h/o PMB ~ 3 months ago. It was "very little bleeding" but she continued to have a brown vaginal discharge for several weeks. Ultrasound showed and EMS 1.28 cm. EMB showed inactive endometrium.         Pt is doing well today. Denies bleeding or pain.    Current Medications  TakingOneTouch Ultra Blue(Blood Glucose Test) no strength Strip as directed In Vitro once a day    Lancets no strength Miscellaneous as directed To obtain blood once daily    Aspirin 81 MG Tablet Chewable 1 tablet Orally Once a day    Atorvastatin Calcium 10 MG Tablet 1 tablet Orally at bedtime    Metformin HCl 500 MG Tablet 1 tablet with meals Orally Once a day    Tizanidine HCl 4 MG Tablet 1 tablet as needed Orally every 8 hrs    Amitriptyline HCl 25 MG Tablet 1 1/2 Orally Once a day    Citalopram Hydrobromide 10 MG Tablet 1 tablets Orally Once a day    Gabapentin 100 MG Capsule 1 tablet Orally at bedtime    Clotrimazole-Betamethasone 0000000 % Cream 1 application to affected area Externally Twice a day    Atorvastatin Calcium 10MG  Tablet 1 tablet Orally at bedtime    Losartan Potassium 50 MG Tablet 1 tablet Orally Once a day    PredniSONE 10 MG Tablet as directed Orally Once a day    Metformin HCl 500MG  Tablet 1 tablet with meals Orally Once a day    DiscontinuedDiflucan(Fluconazole) 150 MG Tablet 1 tablet Orally take 1 tablet to start and repeat with 2nd tablet in 3 days    Medication List reviewed and reconciled with the patient        Past Medical History       HTN.        DM II since 2008.        Ortho- Dr. Ninfa Linden.        Dyslipidemia.        Peipheral neuropathy (04/2015).        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 EEG confirmed.        PTSD/ Anxiety (06/2015) by psych started on Celexa.            Surgical History  ORIF left knee 2/2 fracture  03/2014  C-section x 3       Family History  Father: deceased, diagnosed with HTN  Mother: alive, diagnosed with DM, HTN, CAD  Sister 61: alive, diagnosed with HTN  Sister 2: alive, diagnosed with DM, HTN  denies any GYN family cancer hx.      Social History  General:         Tobacco use               cigarettes:  Former smoker             Quit in year  1985             Pack-year Hx:  9             Tobacco history last updated  08/29/2016       no EXPOSURE TO PASSIVE SMOKE.        no Alcohol.        DIET: regular.        DENTAL CARE: good.  Marital Status: married.        Children: Boys, 3.        EDUCATION: HS.        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        COMMUNICATION BARRIERS: none.     Gyn History  Sexual activity currently sexually active but not much.   Periods : postmenopausal.   LMP had a little spotting that lasted maybe a day.   Denies H/O Birth control.   Last pap smear date 05/2016, negative.   Last mammogram date 07/2015.   Denies H/O Abnormal pap smear.   Denies H/O STD.      OB History  Number of pregnancies  3.   Pregnancy # 1  live birth, C-section delivery.   Pregnancy # 2  live birth, C-section.   Pregnancy # 3  live birth, C-section.      Allergies  Diflucan: hives      Hospitalization/Major Diagnostic Procedure  visit to ER for seizure 05/22/16      Review of Systems  Denies fever/chills, chest pain, SOB, headaches, numbness/tingling. No h/o complication with anesthesia, bleeding disorders or blood clots.    Vital Signs  Wt 191, Wt change 6 lb, Ht 60, BMI 37.30, Pulse sitting 92, BP sitting 110/72.    Physical Examination  GENERAL:          Patient appears  alert and oriented.          General Appearance:  well-appearing, well-developed, no acute distress.          Speech:  clear.   LUNGS:          Auscultation:  no wheezing/rhonchi/rales. CTA bilaterally.   HEART:          Heart sounds:  normal. RRR. no murmur.    ABDOMEN:          General:  soft nontender, nondistended, no masses.   FEMALE GENITOURINARY:          Pelvic  vulva normal, vagina with thin brown discharge, no odor, cervix normal.   EXTREMITIES:          General:  No edema or calf tenderness.           Assessments  1. Pre-operative clearance - Z01.818 (Primary)  2. PMB (postmenopausal bleeding) - N95.0  3. Acute vaginitis - N76.0    Treatment  1. Pre-operative clearance   Notes: Discussed R/B/A of hysteroscopy,D&C, Myosure including bleeding infection perforation. Due to bleeding and thickened endometrium, I stressed it is important to rule out cancer. Pt has been a little resistant b/c bleeding stopped but is agreeable. All questions answered. Preop clearance has been received.      2. Acute vaginitis        LAB: [facility]        Lab: [facility]       WBCS Normal  Normal -        CLUE CELLS None seen  None seen -        TRICHOMONAS None Seen  None Seen -        YEAST None seen  None seen -        OTHER: RBC's present  -                 Tyce Delcid B 08/29/2016 04:55:13 PM > Neg for yeast or BV. Blood seen. Allman,Michelle 08/30/2016 09:28:07 AM > LMTCB Allman,Michelle 08/31/2016  11:35:56 AM > Left a message for pt              Procedure Codes  [id number] ECL WET PREP  99000 SPECIMEN HANDLING      Follow Up  2 Weeks post op

## 2016-09-07 NOTE — Op Note (Signed)
Heidi Lewis, BATTA                 ACCOUNT NO.:  [id number]  MEDICAL RECORD NO.:  [id number]  LOCATION:  [facility]                          FACILITY:  [facility]  PHYSICIAN:  Jola Schmidt, MD   DATE OF BIRTH:  [birth year]  DATE OF PROCEDURE:  09/06/2016 DATE OF DISCHARGE:                              OPERATIVE REPORT   PREOPERATIVE DIAGNOSES:  Postmenopausal bleeding, endometrial mass, and history of fibroids.  POSTOPERATIVE DIAGNOSES:  Fibroids, endometrial polyps, and vaginal atrophy.  PROCEDURES:  Hysteroscopy, D and C with MyoSure, 3 polypectomies, and 2 myomectomies.  SURGEON:  Jola Schmidt, MD.  ASSISTANT:  Technician.  ESTIMATED BLOOD LOSS:  25.  URINE:  100.  Prior to the procedure, local is lidocaine 2%, 10 mL.  SPECIMENS:  Fibroid, polyps, endometrial curettings.  DISPOSITION OF SPECIMEN:  To pathology.  PLAN OF CARE:  To PACU, hemodynamically stable.  COMPLICATIONS:  None.  FINDINGS:  Approximately 4 endometrial polyps, 1 sessile polyp on the posterior endometrial wall that looked friable, a posterior fibroid, and a large anterior fibroid.  Significant vaginal bleeding with vaginal prep, significant atrophy.  PROCEDURE IN DETAIL:  The patient was identified in the holding area. She was then taken to the operating room with IV running.  She underwent general anesthesia without complication.  She was placed in the dorsal lithotomy position, prepped and draped in normal sterile fashion.  SCDs were on her legs.  Time-out was performed.  SCDs were operating.  Graves speculum was inserted into the vagina and I noticed that it looks like a bloody, liquidy discharge.  Although she had significant rugae), the abrasions were oozing slightly.  The cervix was then identified. Paracervical block was performed with 2% lidocaine.  Anterior lip of the cervix was grasped with a single-tooth tenaculum.  Os finder was used and she was dilated up to a 6.  The hysteroscope was  then advanced to show the findings above.  The first polyp was extremely long extending from the fundus down to the internal os of the cervix.  Once that was removed, I was able to see more fibroids and polyps.  The polyps and fibroids were removed with the MyoSure REACH or LITE blade that was uneventful.  The deficit was 275.  Once the resection was complete, the hysteroscope was removed.  A curette was then advanced through the cervix and a sharp curettage of the uterus was performed in all 4 quadrants.  Specimen was sent to Pathology.  Hemostasis was noted.  Single-tooth tenaculum was removed.  All instrument and sponge count were correct.  The vagina was not bleeding.  I did put some lubricant on the inside.  The patient tolerated the procedure well.     Jola Schmidt, MD     EBV/MEDQ  D:  09/06/2016  T:  09/07/2016  Job:  [id number]

## 2016-09-08 ENCOUNTER — Encounter (HOSPITAL_COMMUNITY): Payer: Self-pay | Admitting: Obstetrics and Gynecology

## 2016-09-10 ENCOUNTER — Ambulatory Visit (INDEPENDENT_AMBULATORY_CARE_PROVIDER_SITE_OTHER): Payer: BC Managed Care – PPO | Admitting: Physician Assistant

## 2016-09-10 DIAGNOSIS — M7062 Trochanteric bursitis, left hip: Secondary | ICD-10-CM | POA: Diagnosis not present

## 2016-09-10 NOTE — Progress Notes (Signed)
Office Visit Note   Patient: Heidi Lewis           Date of Birth: 06-03-1956           MRN: [id number] Visit Date: 09/10/2016              Requested by: Lottie Dawson, MD [place]  [address] [place], [place] [address] PCP: [facility]   Assessment & Plan: Visit Diagnoses:  1. Trochanteric bursitis, left hip     Plan: Continue IT band stretching I discussed this with her and actually went through the exercises that she's been doing the wrong. Follow-up when necessary  Follow-Up Instructions: Return if symptoms worsen or fail to improve.   Orders:  No orders of the defined types were placed in this encounter.  No orders of the defined types were placed in this encounter.     Procedures: No procedures performed   Clinical Data: No additional findings.   Subjective: Chief Complaint  Patient presents with  . Left Knee - Pain    Pt complain of Left knee pain, was here 2 weeks ago got injection felt better after injection not sharp pain as before. Left calf still feeling pain when sits on it for long periods. Had xrays when last here 2 weeks ago.    Review of Systems See history of present illness  Objective: Vital Signs: There were no vitals taken for this visit.  Physical Exam  Ortho Exam Hip:  Excellent range of motion of the hip without pain. Minimal tenderness over the left trochanteric region and some minimal tenderness along the IT band. Left knee no tenderness good range of motion without pain Specialty Comments:  No specialty comments available.  Imaging: No results found.   PMFS History: Patient Active Problem List   Diagnosis Date Noted  . Acute posttraumatic stress disorder 06/24/2015  . GAD (generalized anxiety disorder) 06/24/2015  . 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 Chronic daily headache 06/02/2015  . Neck pain 06/02/2015  . Closed fracture of left tibial plateau  05/06/2014  . Tibial plateau fracture 05/06/2014  . Bleeding external hemorrhoids 08/25/2012   Past Medical History:  Diagnosis Date  . Allergy   . Anxiety   . Depression   . Diabetes mellitus    type 2  . Headache(784.0)   . Heart murmur    never caused any problems  . Hemorrhoid   . Hyperlipidemia   . Hypertension    dr  Revonda Humphrey  . Neuromuscular disorder ([place])    carpel tunnel left hand  . Seizures ([place])    Last seizure 05/2016. psychogenic non-epileptic events with normal EEG  . Vaginitis, atrophic     Family History  Problem Relation Age of Onset  . Heart disease Mother   . Cancer Father   . Colon cancer Neg Hx     Past Surgical History:  Procedure Laterality Date  . CESAREAN SECTION     x3  . COLONOSCOPY  05-07-2005  . EVALUATION UNDER ANESTHESIA WITH HEMORRHOIDECTOMY  09/19/2012   Procedure: EXAM UNDER ANESTHESIA WITH HEMORRHOIDECTOMY;  Surgeon: Harl Bowie, MD;  Location: [facility];  Service: General;  Laterality: N/A;  exam under anesthesia hemorrhiodectomy 1 -2 columns  . FINGER ARTHROSCOPY Right    pinky and 4th finger  . HYSTEROSCOPY W/D&C N/A 09/06/2016   Procedure: DILATATION AND CURETTAGE [place];  Surgeon: Thurnell Lose, MD;  Location: [facility];  Service: Gynecology;  Laterality: N/A;  POSSIBLE MYOSURE  . ORIF TIBIA PLATEAU Left 05/06/2014   Procedure: OPEN REDUCTION INTERNAL FIXATION (ORIF) LEFT TIBIAL PLATEAU;  Surgeon: Mcarthur Rossetti, MD;  Location: [facility];  Service: Orthopedics;  Laterality: Left;  . TUBAL LIGATION     Social History   Occupational History  . Not on file.   Social History Main Topics  . Smoking status: Former Smoker    Packs/day: 1.00    Years: 20.00    Types: Cigarettes    Quit date: 12/25/1983  . Smokeless tobacco: Never Used  . Alcohol use No  . Drug use: No  . Sexual activity: Yes    Birth control/ protection: Post-menopausal, Surgical

## 2016-09-25 ENCOUNTER — Ambulatory Visit: Payer: Self-pay | Admitting: Neurology

## 2016-10-05 ENCOUNTER — Encounter: Payer: Self-pay | Admitting: Neurology

## 2016-10-23 ENCOUNTER — Ambulatory Visit (INDEPENDENT_AMBULATORY_CARE_PROVIDER_SITE_OTHER): Payer: BC Managed Care – PPO | Admitting: Neurology

## 2016-10-23 ENCOUNTER — Encounter: Payer: Self-pay | Admitting: Neurology

## 2016-10-23 VITALS — BP 136/78 | HR 63 | Ht 60.0 in | Wt 194.4 lb

## 2016-10-23 DIAGNOSIS — R519 Headache, unspecified: Secondary | ICD-10-CM

## 2016-10-23 DIAGNOSIS — F445 Conversion disorder with seizures or convulsions: Secondary | ICD-10-CM

## 2016-10-23 DIAGNOSIS — R51 Headache: Secondary | ICD-10-CM

## 2016-10-23 MED ORDER — AMITRIPTYLINE HCL 25 MG PO TABS: ORAL_TABLET | ORAL | 3 refills | Status: DC

## 2016-10-23 NOTE — Patient Instructions (Signed)
1. Continue amitriptyline 25mg : take 2 tablets at night 2. Call our office with the name of the psychiatrist you saw at [facility] 3. As per [place] driving laws, no driving after a seizure until 6 months seizure-free 4. Follow-up in 6 months, call for any changes
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
## 2016-11-13 ENCOUNTER — Ambulatory Visit: Payer: Self-pay | Admitting: Neurology

## 2016-11-28 ENCOUNTER — Ambulatory Visit (INDEPENDENT_AMBULATORY_CARE_PROVIDER_SITE_OTHER): Payer: BC Managed Care – PPO | Admitting: Orthopaedic Surgery

## 2017-01-14 ENCOUNTER — Other Ambulatory Visit: Payer: Self-pay | Admitting: Internal Medicine

## 2017-01-14 DIAGNOSIS — Z1231 Encounter for screening mammogram for malignant neoplasm of breast: Secondary | ICD-10-CM

## 2017-01-31 ENCOUNTER — Ambulatory Visit
Admission: RE | Admit: 2017-01-31 | Discharge: 2017-01-31 | Disposition: A | Discharge status: Home / Self Care | Payer: BC Managed Care – PPO | Source: Ambulatory Visit | Attending: Internal Medicine | Admitting: Internal Medicine

## 2017-01-31 DIAGNOSIS — Z1231 Encounter for screening mammogram for malignant neoplasm of breast: Secondary | ICD-10-CM

## 2017-04-23 ENCOUNTER — Ambulatory Visit (INDEPENDENT_AMBULATORY_CARE_PROVIDER_SITE_OTHER): Payer: BC Managed Care – PPO | Admitting: Neurology

## 2017-04-23 ENCOUNTER — Encounter: Payer: Self-pay | Admitting: Neurology

## 2017-04-23 VITALS — BP 135/84 | HR 93 | Ht 62.0 in | Wt 189.0 lb

## 2017-04-23 DIAGNOSIS — G44219 Episodic tension-type headache, not intractable: Secondary | ICD-10-CM | POA: Diagnosis not present

## 2017-04-23 DIAGNOSIS — F445 Conversion disorder with seizures or convulsions: Secondary | ICD-10-CM

## 2017-04-23 DIAGNOSIS — R51 Headache: Secondary | ICD-10-CM

## 2017-04-23 DIAGNOSIS — R519 Headache, unspecified: Secondary | ICD-10-CM

## 2017-04-23 MED ORDER — AMITRIPTYLINE HCL 25 MG PO TABS: ORAL_TABLET | ORAL | 3 refills | Status: DC
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
## 2017-04-23 NOTE — Patient Instructions (Signed)
1. Continue amitriptyline 50mg  at night 2. Continue all your medications 3. If seizures recur, call our office and we will schedule a 48-hour EEG 4. As per [place] driving laws, after a seizure, no driving until 6 months seizure-free 5. Follow-up in 6 months, call for any changes

## 2017-06-11 ENCOUNTER — Encounter (HOSPITAL_COMMUNITY): Payer: Self-pay | Admitting: Emergency Medicine

## 2017-06-11 ENCOUNTER — Emergency Department (HOSPITAL_COMMUNITY)
Admission: EM | Admit: 2017-06-11 | Discharge: 2017-06-11 | Disposition: A | Discharge status: Home / Self Care | Payer: BC Managed Care – PPO | Attending: Emergency Medicine | Admitting: Emergency Medicine

## 2017-06-11 DIAGNOSIS — Z79899 Other long term (current) drug therapy: Secondary | ICD-10-CM | POA: Diagnosis not present

## 2017-06-11 DIAGNOSIS — Z7982 Long term (current) use of aspirin: Secondary | ICD-10-CM | POA: Diagnosis not present

## 2017-06-11 DIAGNOSIS — I10 Essential (primary) hypertension: Secondary | ICD-10-CM | POA: Diagnosis not present

## 2017-06-11 DIAGNOSIS — E119 Type 2 diabetes mellitus without complications: Secondary | ICD-10-CM | POA: Diagnosis not present

## 2017-06-11 DIAGNOSIS — R011 Cardiac murmur, unspecified: Secondary | ICD-10-CM | POA: Insufficient documentation

## 2017-06-11 DIAGNOSIS — Z7984 Long term (current) use of oral hypoglycemic drugs: Secondary | ICD-10-CM | POA: Diagnosis not present

## 2017-06-11 DIAGNOSIS — R569 Unspecified convulsions: Secondary | ICD-10-CM | POA: Insufficient documentation

## 2017-06-11 DIAGNOSIS — Z87891 Personal history of nicotine dependence: Secondary | ICD-10-CM | POA: Insufficient documentation

## 2017-06-11 DIAGNOSIS — E785 Hyperlipidemia, unspecified: Secondary | ICD-10-CM | POA: Diagnosis not present

## 2017-06-11 LAB — CBC WITH DIFFERENTIAL/PLATELET
Basophils Absolute: 0 10*3/uL (ref 0.0–0.1)
Basophils Relative: 0 %
Eosinophils Absolute: 0.3 10*3/uL (ref 0.0–0.7)
Eosinophils Relative: 4 %
HCT: 38.9 % (ref 36.0–46.0)
Hemoglobin: 12.9 g/dL (ref 12.0–15.0)
Lymphocytes Relative: 24 %
Lymphs Abs: 2.2 10*3/uL (ref 0.7–4.0)
MCH: 26.5 pg (ref 26.0–34.0)
MCHC: 33.2 g/dL (ref 30.0–36.0)
MCV: 79.9 fL (ref 78.0–100.0)
Monocytes Absolute: 0.7 10*3/uL (ref 0.1–1.0)
Monocytes Relative: 8 %
Neutro Abs: 5.8 10*3/uL (ref 1.7–7.7)
Neutrophils Relative %: 64 %
Platelets: 249 10*3/uL (ref 150–400)
RBC: 4.87 MIL/uL (ref 3.87–5.11)
RDW: 14.7 % (ref 11.5–15.5)
WBC: 9 10*3/uL (ref 4.0–10.5)

## 2017-06-11 LAB — BASIC METABOLIC PANEL
Anion gap: 9 (ref 5–15)
BUN: 11 mg/dL (ref 6–20)
CO2: 24 mmol/L (ref 22–32)
Calcium: 9.9 mg/dL (ref 8.9–10.3)
Chloride: 105 mmol/L (ref 101–111)
Creatinine, Ser: 0.82 mg/dL (ref 0.44–1.00)
GFR calc Af Amer: >60 mL/min (ref >60)
GFR calc non Af Amer: >60 mL/min (ref >60)
Glucose, Bld: 99 mg/dL (ref 65–99)
Potassium: 4.1 mmol/L (ref 3.5–5.1)
Sodium: 138 mmol/L (ref 135–145)

## 2017-06-11 MED ORDER — IBUPROFEN 400 MG PO TABS
600.0000 mg | ORAL_TABLET | Freq: Once | ORAL | Status: AC
  Administered 2017-06-11: 17:00:00 600 mg via ORAL
  Filled 2017-06-11: qty 1

## 2017-06-11 MED ORDER — SODIUM CHLORIDE 0.9 % IV SOLN: INTRAVENOUS | Status: DC

## 2017-06-11 NOTE — ED Triage Notes (Signed)
Per [facility],  Pt was being seen at [place] for eczema. Pt was checking out when she had a seizure, fell to the ground. Pt had 3 seizures witnessed by staff at [facility] physicians. EMS reports [place] staff stated she had some confusion. When EMS arrived, pt was alert and oriented x4. Pt A&O x4, complaining of headache. Pt reports history of seizure, but does not take anything for them. Pt states her last seizure was in February.

## 2017-06-11 NOTE — ED Provider Notes (Signed)
[name] DEPT Provider Note   CSN: [id number] Arrival date & time: 06/11/17  1517     History   Chief Complaint Chief Complaint  Patient presents with  . Seizures    HPI Heidi Lewis is a 61 y.o. female.  HPI   61 year old female with seizure-like activity. She is coming from her 35 office. She was there for an unrelated appointment. She probably had 3 separate events by staff. Generalized shaking. Initially poorly responsive. No incontinence of oral trauma. On EMS arrival, patient is alert and oriented 4. Prinivil headache. She states that the headache is currently resolved.  Past Medical History:  Diagnosis Date  . Allergy   . Anxiety   . Depression   . Diabetes mellitus    type 2  . Headache(784.0)   . Heart murmur    never caused any problems  . Hemorrhoid   . Hyperlipidemia   . Hypertension    dr  Revonda Humphrey  . Neuromuscular disorder ([place])    carpel tunnel left hand  . 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 Vaginitis, atrophic     Patient Active Problem List   Diagnosis Date Noted  . Episodic tension-type headache, not intractable 04/23/2017  . Acute posttraumatic stress disorder 06/24/2015  . GAD (generalized anxiety disorder) 06/24/2015  . 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 Chronic daily headache 06/02/2015  . Neck pain 06/02/2015  . Closed fracture of left tibial plateau 05/06/2014  . Tibial plateau fracture 05/06/2014  . Bleeding external hemorrhoids 08/25/2012    Past Surgical History:  Procedure Laterality Date  . CESAREAN SECTION     x3  . COLONOSCOPY  05-07-2005  . EVALUATION UNDER ANESTHESIA WITH HEMORRHOIDECTOMY  09/19/2012   Procedure: EXAM UNDER ANESTHESIA WITH HEMORRHOIDECTOMY;  Surgeon: Harl Bowie, MD;  Location: [facility];  Service: General;  Laterality: N/A;  exam under anesthesia hemorrhiodectomy 1 -2 columns  . FINGER ARTHROSCOPY Right     pinky and 4th finger  . HYSTEROSCOPY W/D&C N/A 09/06/2016   Procedure: DILATATION AND CURETTAGE [place];  Surgeon: Thurnell Lose, MD;  Location: [facility];  Service: Gynecology;  Laterality: N/A;  POSSIBLE MYOSURE  . ORIF TIBIA PLATEAU Left 05/06/2014   Procedure: OPEN REDUCTION INTERNAL FIXATION (ORIF) LEFT TIBIAL PLATEAU;  Surgeon: Mcarthur Rossetti, MD;  Location: [facility];  Service: Orthopedics;  Laterality: Left;  . TUBAL LIGATION      OB History    No data available       Home Medications    Prior to Admission medications   Medication Sig Start Date End Date Taking? Authorizing Provider  acetaminophen (TYLENOL) 500 MG tablet Take 1,000 mg by mouth every 6 (six) hours as needed for moderate pain or headache.    Yes [provider]  amitriptyline (ELAVIL) 25 MG tablet Take 2 tablets at night Patient taking differently: Take 25 mg by mouth at bedtime. Take 2 tablets at night 04/23/17  Yes Cameron Sprang, MD  aspirin EC 81 MG tablet Take 81 mg by mouth daily.   Yes [provider]  atorvastatin (LIPITOR) 10 MG tablet Take 10 mg by mouth 3 (three) times daily - between meals and at bedtime. Take 1 tablet daily 10/04/15  Yes [provider]  citalopram (CELEXA) 10 MG tablet Take 10 mg by mouth daily. Take 1 tablet daily 10/04/15  Yes [provider]  clobetasol ointment (TEMOVATE) 0.05 %  Apply 1 application topically daily. 04/08/17  Yes [provider]  clotrimazole-betamethasone (LOTRISONE) cream Apply 1 application topically as directed. As needed for rash.   Yes [provider]  escitalopram (LEXAPRO) 10 MG tablet Take 10 mg by mouth daily. 06/11/17  Yes [provider]  gabapentin (NEURONTIN) 100 MG capsule Take 100 mg by mouth at bedtime.   Yes [provider]  hydrOXYzine (ATARAX/VISTARIL) 25 MG tablet Take 25 mg by mouth daily. 06/11/17  Yes [provider]  ibuprofen  (ADVIL,MOTRIN) 600 MG tablet Take 1 tablet (600 mg total) by mouth every 6 (six) hours as needed. 09/06/16  Yes Thurnell Lose, MD  loratadine (CLARITIN) 10 MG tablet Take 10 mg by mouth daily as needed for allergies.   Yes [provider]  losartan (COZAAR) 50 MG tablet Take 50 mg by mouth every morning.    Yes [provider]  metFORMIN (GLUCOPHAGE) 500 MG tablet Take 500 mg by mouth daily with breakfast.    Yes [provider]  triamcinolone cream (KENALOG) 0.1 % Apply 1 application topically daily. 06/11/17  Yes [provider]  tiZANidine (ZANAFLEX) 4 MG tablet Take 1 tablet (4 mg total) by mouth every 8 (eight) hours as needed for muscle spasms (neck pain). Patient not taking: Reported on 06/11/2017 07/05/15   Cameron Sprang, MD    Family History Family History  Problem Relation Age of Onset  . Heart disease Mother   . Cancer Father   . Colon cancer Neg Hx     Social History Social History  Substance Use Topics  . Smoking status: Former Smoker    Packs/day: 1.00    Years: 20.00    Types: Cigarettes    Quit date: 12/25/1983  . Smokeless tobacco: Never Used  . Alcohol use No     Allergies   Codeine   Review of Systems Review of Systems  All systems reviewed and negative, other than as noted in HPI.  Physical Exam Updated Vital Signs BP (!) 136/91   Pulse 82   Temp 99.3 F (37.4 C) (Oral)   Resp (!) 22   SpO2 98%   Physical Exam  Constitutional: She appears well-developed and well-nourished. No distress.  HENT:  Head: Normocephalic and atraumatic.  Eyes: Conjunctivae are normal. Right eye exhibits no discharge. Left eye exhibits no discharge.  Neck: Neck supple.  Cardiovascular: Normal rate, regular rhythm and normal heart sounds.  Exam reveals no gallop and no friction rub.   No murmur heard. Pulmonary/Chest: Effort normal and breath sounds normal. No respiratory distress.  Abdominal: Soft. She exhibits no distension. There  is no tenderness.  Musculoskeletal: She exhibits no edema or tenderness.  Neurological: She is alert.  Skin: Skin is warm and dry.  Psychiatric: She has a normal mood and affect. Her behavior is normal. Thought content normal.  Nursing note and vitals reviewed.    ED Treatments / Results  Labs (all labs ordered are listed, but only abnormal results are displayed) Labs Reviewed  CBC WITH DIFFERENTIAL/PLATELET  BASIC METABOLIC PANEL    EKG  EKG Interpretation  Date/Time:  Tuesday June 11 2017 15:31:43 EDT Ventricular Rate:  90 PR Interval:    QRS Duration: 80 QT Interval:  368 QTC Calculation: 451 R Axis:   39 Text Interpretation:  Sinus rhythm No significant change since last tracing Confirmed by Orlie Dakin [phone]) on 06/11/2017 3:37:03 PM       Radiology No results found.  Procedures Procedures (including  critical care time)  Medications Ordered in ED Medications  0.9 %  sodium chloride infusion (not administered)  ibuprofen (ADVIL,MOTRIN) tablet 600 mg (600 mg Oral Given 06/11/17 1650)     Initial Impression / Assessment and Plan / ED Course  I have reviewed the triage vital signs and the nursing notes.  Pertinent labs & imaging results that were available during my care of the patient were reviewed by me and considered in my medical decision making (see chart for details).     61 year old female with seizure-like activity. History of the same. She is currently back to her baseline. Afebrile. Well-appearing. Nonfocal neurological examination. Basic labs are unremarkable. Family states that "she always has these when she is at the doctors." Followed by neurology. He felt these events may be psychogenic. Regardless, I doubt an emergent process.   Final Clinical Impressions(s) / ED Diagnoses   Final diagnoses:  Seizure-like activity [facility])    New Prescriptions New Prescriptions   No medications on file     Virgel Manifold, MD 06/28/17 1338

## 2017-06-12 ENCOUNTER — Telehealth: Payer: Self-pay | Admitting: Neurology

## 2017-06-12 NOTE — Telephone Encounter (Signed)
Pt called and said she had a seizure yesterday and was advised to call Dr Delice Lesch

## 2017-09-03 DIAGNOSIS — M25542 Pain in joints of left hand: Secondary | ICD-10-CM | POA: Insufficient documentation

## 2017-09-25 ENCOUNTER — Ambulatory Visit: Payer: BC Managed Care – PPO | Admitting: Neurology

## 2017-09-25 ENCOUNTER — Encounter: Payer: Self-pay | Admitting: Neurology

## 2017-09-25 ENCOUNTER — Other Ambulatory Visit: Payer: Self-pay

## 2017-09-25 VITALS — BP 154/88 | HR 94 | Ht 62.0 in | Wt 196.0 lb

## 2017-09-25 DIAGNOSIS — F445 Conversion disorder with seizures or convulsions: Secondary | ICD-10-CM | POA: Diagnosis not present

## 2017-09-25 DIAGNOSIS — G44219 Episodic tension-type headache, not intractable: Secondary | ICD-10-CM
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
## 2017-09-25 NOTE — Patient Instructions (Signed)
1. Continue amitriptyline 25mg : take 2 tablets at night 2. If seizures happen again, call our office and we will proceed with longer EEG testing. Would also start going back to the therapist if this happens. 3. Follow-up in 6 months, call for any changes

## 2017-10-28 ENCOUNTER — Ambulatory Visit: Payer: Self-pay | Admitting: Neurology

## 2017-11-28 ENCOUNTER — Other Ambulatory Visit: Payer: Self-pay | Admitting: Neurology

## 2017-11-28 DIAGNOSIS — R51 Headache: Principal | ICD-10-CM

## 2017-11-28 DIAGNOSIS — R519 Headache, unspecified: Secondary | ICD-10-CM

## 2017-12-24 ENCOUNTER — Other Ambulatory Visit: Payer: Self-pay | Admitting: Internal Medicine

## 2017-12-24 DIAGNOSIS — Z1231 Encounter for screening mammogram for malignant neoplasm of breast: Secondary | ICD-10-CM

## 2018-02-07 ENCOUNTER — Ambulatory Visit
Admission: RE | Admit: 2018-02-07 | Discharge: 2018-02-07 | Disposition: A | Discharge status: Home / Self Care | Payer: BC Managed Care – PPO | Source: Ambulatory Visit | Attending: Internal Medicine | Admitting: Internal Medicine

## 2018-02-07 DIAGNOSIS — Z1231 Encounter for screening mammogram for malignant neoplasm of breast: Secondary | ICD-10-CM

## 2018-03-26 ENCOUNTER — Ambulatory Visit: Payer: Self-pay | Admitting: Neurology

## 2018-04-04 ENCOUNTER — Ambulatory Visit: Payer: BC Managed Care – PPO | Admitting: Neurology

## 2018-05-18 ENCOUNTER — Encounter (HOSPITAL_COMMUNITY): Payer: Self-pay | Admitting: Emergency Medicine

## 2018-05-18 ENCOUNTER — Ambulatory Visit (HOSPITAL_COMMUNITY)
Admission: EM | Admit: 2018-05-18 | Discharge: 2018-05-18 | Disposition: A | Discharge status: Home / Self Care | Payer: BC Managed Care – PPO | Attending: Family Medicine | Admitting: Family Medicine

## 2018-05-18 DIAGNOSIS — S61211A Laceration without foreign body of left index finger without damage to nail, initial encounter: Secondary | ICD-10-CM

## 2018-05-18 MED ORDER — LIDOCAINE-EPINEPHRINE-TETRACAINE (LET) SOLUTION
NASAL | Status: AC
  Filled 2018-05-18: qty 3

## 2018-05-18 MED ORDER — LIDOCAINE-EPINEPHRINE-TETRACAINE (LET) SOLUTION
3.0000 mL | Freq: Once | NASAL | Status: AC
  Administered 2018-05-18: 3 mL via TOPICAL

## 2018-05-18 NOTE — ED Triage Notes (Signed)
Pt states she was cutting meat and the blade went across her L pointer finger. Happened last night around 10pm. Pt c/o ongoing pain.

## 2018-05-18 NOTE — ED Provider Notes (Signed)
[facility]    CSN: [id number] Arrival date & time: 05/18/18  1634     History   Chief Complaint Chief Complaint  Patient presents with  . Finger Laceration    HPI Heidi Lewis is a 62 y.o. female.   HPI Cut fingertip with a kitchen knife while preparing dinner yesterday.  Not quite 24 hours old.  She put some antibiotic ointment and put a Band-Aid on it.  She is here for persistent pain.  Sensation is normal at the tip. Patient states she has otherwise in good health.  Compliant with her medication.  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 Past Medical History:  Diagnosis Date  . Allergy   . Anxiety   . Depression   . Diabetes mellitus    type 2  . Headache(784.0)   . Heart murmur    never caused any problems  . Hemorrhoid   . Hyperlipidemia   . Hypertension    dr  Revonda Humphrey  . Neuromuscular disorder ([place])    carpel tunnel left hand  . 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 Vaginitis, atrophic     Patient Active Problem List   Diagnosis Date Noted  . Episodic tension-type headache, not intractable 04/23/2017  . Acute posttraumatic stress disorder 06/24/2015  . GAD (generalized anxiety disorder) 06/24/2015  . 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 Chronic daily headache 06/02/2015  . Neck pain 06/02/2015  . Closed fracture of left tibial plateau 05/06/2014  . Tibial plateau fracture 05/06/2014  . Bleeding external hemorrhoids 08/25/2012    Past Surgical History:  Procedure Laterality Date  . CESAREAN SECTION     x3  . COLONOSCOPY  05-07-2005  . EVALUATION UNDER ANESTHESIA WITH HEMORRHOIDECTOMY  09/19/2012   Procedure: EXAM UNDER ANESTHESIA WITH HEMORRHOIDECTOMY;  Surgeon: Harl Bowie, MD;  Location: [facility];  Service: General;  Laterality: N/A;  exam under anesthesia hemorrhiodectomy 1 -2 columns  . FINGER ARTHROSCOPY Right    pinky  and 4th finger  . HYSTEROSCOPY W/D&C N/A 09/06/2016   Procedure: DILATATION AND CURETTAGE [place];  Surgeon: Thurnell Lose, MD;  Location: [facility];  Service: Gynecology;  Laterality: N/A;  POSSIBLE MYOSURE  . ORIF TIBIA PLATEAU Left 05/06/2014   Procedure: OPEN REDUCTION INTERNAL FIXATION (ORIF) LEFT TIBIAL PLATEAU;  Surgeon: Mcarthur Rossetti, MD;  Location: [facility];  Service: Orthopedics;  Laterality: Left;  . TUBAL LIGATION      OB History   None      Home Medications    Prior to Admission medications   Medication Sig Start Date End Date Taking? Authorizing Provider  acetaminophen (TYLENOL) 500 MG tablet Take 1,000 mg by mouth every 6 (six) hours as needed for moderate pain or headache.     [provider]  amitriptyline (ELAVIL) 25 MG tablet Take 2 tablets at night Patient taking differently: Take 25 mg by mouth at bedtime. Take 2 tablets at night 04/23/17   Cameron Sprang, MD  amitriptyline (ELAVIL) 25 MG tablet TAKE TWO TABLETS BY MOUTH AT NIGHT 11/29/17   Cameron Sprang, MD  aspirin EC 81 MG tablet Take 81 mg by mouth daily.    [provider]  atorvastatin (LIPITOR) 10 MG tablet Take 10 mg by mouth 3 (three) times daily - between meals and at bedtime. Take 1 tablet daily 10/04/15   [provider]  citalopram (  CELEXA) 10 MG tablet Take 10 mg by mouth daily. Take 1 tablet daily 10/04/15   [provider]  clobetasol ointment (TEMOVATE) 2.77 % Apply 1 application topically daily. 04/08/17   [provider]  clotrimazole-betamethasone (LOTRISONE) cream Apply 1 application topically as directed. As needed for rash.    [provider]  escitalopram (LEXAPRO) 10 MG tablet Take 10 mg by mouth daily. 06/11/17   [provider]  gabapentin (NEURONTIN) 100 MG capsule Take 100 mg by mouth at bedtime.    [provider]  hydrOXYzine (ATARAX/VISTARIL) 25 MG tablet Take 25 mg by mouth daily.  06/11/17   [provider]  ibuprofen (ADVIL,MOTRIN) 600 MG tablet Take 1 tablet (600 mg total) by mouth every 6 (six) hours as needed. 09/06/16   Thurnell Lose, MD  loratadine (CLARITIN) 10 MG tablet Take 10 mg by mouth daily as needed for allergies.    [provider]  losartan (COZAAR) 50 MG tablet Take 50 mg by mouth every morning.     [provider]  metFORMIN (GLUCOPHAGE) 500 MG tablet Take 500 mg by mouth daily with breakfast.     [provider]  tiZANidine (ZANAFLEX) 4 MG tablet Take 1 tablet (4 mg total) by mouth every 8 (eight) hours as needed for muscle spasms (neck pain). 07/05/15   Cameron Sprang, MD  triamcinolone cream (KENALOG) 0.1 % Apply 1 application topically daily. 06/11/17   [provider]    Family History Family History  Problem Relation Age of Onset  . Heart disease Mother   . Cancer Father   . Colon cancer Neg Hx     Social History Social History   Tobacco Use  . Smoking status: Former Smoker    Packs/day: 1.00    Years: 20.00    Pack years: 20.00    Types: Cigarettes    Last attempt to quit: 12/25/1983    Years since quitting: 34.4  . Smokeless tobacco: Never Used  Substance Use Topics  . Alcohol use: No    Alcohol/week: 0.0 standard drinks  . Drug use: No     Allergies   Codeine   Review of Systems Review of Systems  Constitutional: Negative for chills and fever.  HENT: Negative for ear pain and sore throat.   Eyes: Negative for pain and visual disturbance.  Respiratory: Negative for cough and shortness of breath.   Cardiovascular: Negative for chest pain and palpitations.  Gastrointestinal: Negative for abdominal pain and vomiting.  Genitourinary: Negative for dysuria and hematuria.  Musculoskeletal: Negative for arthralgias and back pain.  Skin: Positive for wound. Negative for color change and rash.  Neurological: Negative for seizures and syncope.  All other systems reviewed and are  negative.    Physical Exam Triage Vital Signs ED Triage Vitals [05/18/18 1701]  Enc Vitals Group     BP (!) 150/94     Pulse Rate (!) 102     Resp 18     Temp 98.3 F (36.8 C)     Temp src      SpO2 97 %     Weight      Height      Head Circumference      Peak Flow      Pain Score      Pain Loc      Pain Edu?      Excl. in [place]?    No data found.  Updated Vital Signs BP (!) 150/94  Pulse (!) 102   Temp 98.3 F (36.8 C)   Resp 18   SpO2 97%   Visual Acuity Right Eye Distance:   Left Eye Distance:   Bilateral Distance:    Right Eye Near:   Left Eye Near:    Bilateral Near:     Physical Exam  Constitutional: She appears well-developed and well-nourished. No distress.  HENT:  Head: Normocephalic and atraumatic.  Mouth/Throat: Oropharynx is clear and moist.  Eyes: Pupils are equal, round, and reactive to light. Conjunctivae are normal.  Neck: Normal range of motion.  Cardiovascular: Normal rate.  Pulmonary/Chest: Effort normal. No respiratory distress.  Abdominal: Soft. She exhibits no distension.  Musculoskeletal: Normal range of motion. She exhibits no edema.  Neurological: She is alert.  Skin: Skin is warm and dry.  Index finger of left hand has a longitudinal laceration through the pad, at the upper third of the distal phalanx.  Partial thickness.  No bleeding.  No infection.  Very tender.     UC Treatments / Results  Labs (all labs ordered are listed, but only abnormal results are displayed) Labs Reviewed - No data to display  EKG None  Radiology No results found.  Procedures Procedures (including critical care time) Area was cleansed with Betadine.  Glued.  Dressing was placed Medications Ordered in UC Medications  lidocaine-EPINEPHrine-tetracaine (LET) solution (3 mLs Topical Given 05/18/18 1712)    Initial Impression / Assessment and Plan / UC Course  I have reviewed the triage vital signs and the nursing notes.  Pertinent labs &  imaging results that were available during my care of the patient were reviewed by me and considered in my medical decision making (see chart for details).     Discussed watching for infection. Final Clinical Impressions(s) / UC Diagnoses   Final diagnoses:  Laceration of left index finger without foreign body without damage to nail, initial encounter     Discharge Instructions     Keep bandaged for the rest of the week. Watch for infection-redness, increased swelling, increased pain Return if any problems develop   ED Prescriptions    None     Controlled Substance Prescriptions [place] Controlled Substance Registry consulted? Not Applicable   Raylene Everts, MD 05/18/18 1757

## 2018-05-18 NOTE — ED Notes (Signed)
Before giving LET, pt tested for sensation and capillary refill. Pt has normal sensation in finger, cap refill <3 seconds.

## 2018-05-18 NOTE — Discharge Instructions (Addendum)
Keep bandaged for the rest of the week. Watch for infection-redness, increased swelling, increased pain Return if any problems develop

## 2018-05-26 ENCOUNTER — Other Ambulatory Visit: Payer: Self-pay

## 2018-05-26 ENCOUNTER — Ambulatory Visit: Payer: BC Managed Care – PPO | Admitting: Neurology

## 2018-05-26 ENCOUNTER — Encounter: Payer: Self-pay | Admitting: Neurology

## 2018-05-26 VITALS — BP 156/110 | HR 94 | Ht 61.0 in | Wt 192.0 lb

## 2018-05-26 DIAGNOSIS — R251 Tremor, unspecified: Secondary | ICD-10-CM | POA: Diagnosis not present

## 2018-05-26 DIAGNOSIS — G44219 Episodic tension-type headache, not intractable: Secondary | ICD-10-CM | POA: Diagnosis not present

## 2018-05-26 DIAGNOSIS — R519 Headache, unspecified: Secondary | ICD-10-CM

## 2018-05-26 DIAGNOSIS — F445 Conversion disorder with seizures or convulsions: Secondary | ICD-10-CM | POA: Diagnosis not present

## 2018-05-26 DIAGNOSIS — R55 Syncope and collapse: Secondary | ICD-10-CM | POA: Diagnosis not present

## 2018-05-26 DIAGNOSIS — R51 Headache: Secondary | ICD-10-CM

## 2018-05-26 MED ORDER — AMITRIPTYLINE HCL 25 MG PO TABS: ORAL_TABLET | ORAL | 3 refills | Status: AC

## 2018-05-26 NOTE — Patient Instructions (Addendum)
1. Check orthostatic vital signs 2. Reduce amitriptyline 25mg : Take 1 tablet every night 3. Schedule 48-hour EEG 4. Follow-up after EEG, call for any changes
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
## 2018-05-28 ENCOUNTER — Telehealth: Payer: Self-pay | Admitting: *Deleted

## 2018-05-28 NOTE — Telephone Encounter (Signed)
LM to call to set up appt for 48 hour ambulatory EEG. Offered Sept 4th @ 9:00, Sept 23rd @ 11:30 or Sept 30th @ 9:30. Will try other phone number today as well and leave same message if no anwer.

## 2018-06-04 ENCOUNTER — Ambulatory Visit (INDEPENDENT_AMBULATORY_CARE_PROVIDER_SITE_OTHER): Payer: BC Managed Care – PPO | Admitting: Neurology

## 2018-06-04 DIAGNOSIS — R519 Headache, unspecified: Secondary | ICD-10-CM

## 2018-06-04 DIAGNOSIS — G44219 Episodic tension-type headache, not intractable: Secondary | ICD-10-CM

## 2018-06-04 DIAGNOSIS — R51 Headache: Secondary | ICD-10-CM

## 2018-06-04 DIAGNOSIS — R55 Syncope and collapse: Secondary | ICD-10-CM

## 2018-06-04 DIAGNOSIS — R251 Tremor, unspecified: Secondary | ICD-10-CM

## 2018-06-04 DIAGNOSIS — F445 Conversion disorder with seizures or convulsions: Secondary | ICD-10-CM

## 2018-06-25 ENCOUNTER — Telehealth: Payer: Self-pay | Admitting: Neurology

## 2018-06-25 NOTE — Telephone Encounter (Signed)
Patient is calling trying to get her recent EEG results. Please call her at [phone]. Thanks!

## 2018-06-26 NOTE — Telephone Encounter (Signed)
Pt has not had a recent EEG.  I believe she is calling to schedule 48 hour EEG.

## 2018-06-26 NOTE — Telephone Encounter (Signed)
Patient had 48 Hour EEG on 06/04/18 and removed on 06/06/18. She would like results. Thanks

## 2018-06-27 NOTE — Telephone Encounter (Signed)
Spoke with pt relayed normal EEG results.

## 2018-06-27 NOTE — Telephone Encounter (Signed)
Pls let her know the brain wave test was normal, thanks 

## 2018-06-29 NOTE — Procedures (Signed)
ELECTROENCEPHALOGRAM REPORT  Dates of Recording: 06/04/2018 9:50AM to 06/06/2018 9:55AM  Patient's Name: Heidi Lewis MRN: [id number] Date of Birth: 1955-11-07  Referring Provider: Dr. Ellouise Newer  Procedure: 48-hour ambulatory EEG  History: This is a 62 year old woman with a history of non-epileptic events, with recurrent episodes of shaking most recently in sleep.  Medications:  NEURONTIN 100 MG capsule TYLENOL 500 MG tablet   ELAVIL 25 MG tablet  ELAVIL 25 MG tablet  aspirin EC 81 MG tablet  LIPITOR 10 MG tablet CELEXA 10 MG tablet  TEMOVATE 0.05 %  LOTRISONE cream LEXAPRO 10 MG tablet  ATARAX/VISTARIL 25 MG tablet  ADVIL,MOTRIN 200 MG tablet  CLARITIN 10 MG tablet  COZAAR 50 MG tablet  GLUCOPHAGE 500 MG tablet  ZANAFLEX 4 MG tablet  KENALOG 0.1 %    Technical Summary: This is a 48-hour multichannel digital EEG recording measured by the international 10-20 system with electrodes applied with paste and impedances below 5000 ohms performed as portable with EKG monitoring.  The digital EEG was referentially recorded, reformatted, and digitally filtered in a variety of bipolar and referential montages for optimal display.    DESCRIPTION OF RECORDING: During maximal wakefulness, the background activity consisted of a symmetric 10 Hz posterior dominant rhythm which was reactive to eye opening.  There were no epileptiform discharges or focal slowing seen in wakefulness.  During the recording, the patient progresses through wakefulness, drowsiness, and Stage 2 sleep.  Again, there were no epileptiform discharges seen.  Events: On 09/04 2003 hours, she has dizziness. Electrographically, there were no EEG or EKG changes seen.  On 09/05 at 1228 hours, she had a headache. Electrographically, there were no EEG or EKG changes seen.  On 09/05 at 1730 hours, she has a headache. Electrographically, there were no EEG or EKG changes seen.  On 09/05 at 2030 hours, she has a  headache. Electrographically, there were no EEG or EKG changes seen.  On 09/06 all morning she feels weak. Electrographically, there were no EEG or EKG changes seen.  There were no electrographic seizures seen.  EKG lead was unremarkable.  IMPRESSION: This 48-hour ambulatory EEG study is normal.    CLINICAL CORRELATION: A normal EEG does not exclude a clinical diagnosis of epilepsy. Typical episodes were not captured. Headaches and dizziness did not show electrographic correlate. If further clinical questions remain, inpatient video EEG monitoring may be helpful.   Ellouise Newer, M.D.

## 2018-11-03 ENCOUNTER — Emergency Department (HOSPITAL_COMMUNITY)
Admission: EM | Admit: 2018-11-03 | Discharge: 2018-11-03 | Disposition: A | Discharge status: Home / Self Care | Payer: BC Managed Care – PPO | Attending: Emergency Medicine | Admitting: Emergency Medicine

## 2018-11-03 ENCOUNTER — Encounter (HOSPITAL_COMMUNITY): Payer: Self-pay | Admitting: *Deleted

## 2018-11-03 ENCOUNTER — Emergency Department (HOSPITAL_COMMUNITY): Payer: BC Managed Care – PPO

## 2018-11-03 ENCOUNTER — Other Ambulatory Visit: Payer: Self-pay

## 2018-11-03 DIAGNOSIS — S0990XA Unspecified injury of head, initial encounter: Secondary | ICD-10-CM

## 2018-11-03 DIAGNOSIS — I10 Essential (primary) hypertension: Secondary | ICD-10-CM | POA: Insufficient documentation

## 2018-11-03 DIAGNOSIS — Z87891 Personal history of nicotine dependence: Secondary | ICD-10-CM | POA: Diagnosis not present

## 2018-11-03 DIAGNOSIS — R55 Syncope and collapse: Secondary | ICD-10-CM | POA: Diagnosis present

## 2018-11-03 DIAGNOSIS — G40909 Epilepsy, unspecified, not intractable, without status epilepticus: Secondary | ICD-10-CM | POA: Insufficient documentation

## 2018-11-03 DIAGNOSIS — R569 Unspecified convulsions: Secondary | ICD-10-CM

## 2018-11-03 DIAGNOSIS — E119 Type 2 diabetes mellitus without complications: Secondary | ICD-10-CM | POA: Insufficient documentation

## 2018-11-03 LAB — BASIC METABOLIC PANEL
ANION GAP: 11 (ref 5–15)
BUN: 10 mg/dL (ref 6–20)
CALCIUM: 9.7 mg/dL (ref 8.9–10.3)
CHLORIDE: 105 mmol/L (ref 98–111)
CO2: 25 mmol/L (ref 22–32)
CREATININE: 0.65 mg/dL (ref 0.44–1.00)
GFR calc non Af Amer: >60 mL/min (ref >60)
GLUCOSE: 113 mg/dL — AB (ref 70–99)
Potassium: 4.1 mmol/L (ref 3.5–5.1)
Sodium: 141 mmol/L (ref 135–145)

## 2018-11-03 LAB — URINALYSIS, ROUTINE W REFLEX MICROSCOPIC
Bilirubin Urine: NEGATIVE
Glucose, UA: NEGATIVE mg/dL
HGB URINE DIPSTICK: NEGATIVE
Ketones, ur: NEGATIVE mg/dL
LEUKOCYTES UA: NEGATIVE
Nitrite: NEGATIVE
PH: 7 (ref 5.0–8.0)
Protein, ur: NEGATIVE mg/dL
Specific Gravity, Urine: 1.012 (ref 1.005–1.030)

## 2018-11-03 LAB — CBC
HEMATOCRIT: 39.9 % (ref 36.0–46.0)
Hemoglobin: 12.9 g/dL (ref 12.0–15.0)
MCH: 26.3 pg (ref 26.0–34.0)
MCHC: 32.3 g/dL (ref 30.0–36.0)
MCV: 81.4 fL (ref 80.0–100.0)
NRBC: 0 % (ref 0.0–0.2)
PLATELETS: 268 10*3/uL (ref 150–400)
RBC: 4.9 MIL/uL (ref 3.87–5.11)
RDW: 14.4 % (ref 11.5–15.5)
WBC: 8.1 10*3/uL (ref 4.0–10.5)

## 2018-11-03 MED ORDER — ONDANSETRON 4 MG PO TBDP
4.0000 mg | ORAL_TABLET | Freq: Once | ORAL | Status: AC | PRN
  Administered 2018-11-03: 4 mg via ORAL
  Filled 2018-11-03: qty 1

## 2018-11-03 MED ORDER — ONDANSETRON HCL 4 MG/2ML IJ SOLN: 4.0000 mg | Freq: Once | INTRAMUSCULAR | Status: DC

## 2018-11-03 MED ORDER — SODIUM CHLORIDE 0.9% FLUSH: 3.0000 mL | Freq: Once | INTRAVENOUS | Status: DC

## 2018-11-03 MED ORDER — ACETAMINOPHEN 325 MG PO TABS
650.0000 mg | ORAL_TABLET | Freq: Once | ORAL | Status: AC
  Administered 2018-11-03: 650 mg via ORAL
  Filled 2018-11-03: qty 2

## 2018-11-03 NOTE — ED Triage Notes (Signed)
Pt reports she was at work today, when she fell.  Possibly hitting the back of her head on the stairs.  After she fell, she endorses having 2 seizures.  She is A&Ox 4.  Hx of Seizure, she takes Gabapentin.  She endorses pain in back of her head and L arm pain.

## 2018-11-03 NOTE — ED Notes (Signed)
Patient verbalizes understanding of discharge instructions. Opportunity for questioning and answers were provided. Armband removed by staff, pt discharged from ED.  

## 2018-11-03 NOTE — ED Provider Notes (Signed)
Emergency Department Provider Note   I have reviewed the triage vital signs and the nursing notes.   HISTORY  Chief Complaint Seizures and Near Syncope   HPI Heidi Lewis is a 63 y.o. female with PMH of DM, HLD, HTN, and h/o seizure presents to the emergency department after mechanical fall while at work along with 2 episodes of seizure-like activity.  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  She struck the back of her head but did not lose consciousness.  Since the fall she is also noticed some pain in the left arm that is mainly in the forearm.  Denies any shoulder pain or neck discomfort.  Shortly after the fall the patient reports having 2, brief episodes of seizure-like activity.  She states with past seizures she remains alert, eyes closed, she begins to shake, but can hear everything being said.  This is what she experienced today.  Her last seizure was in February of last year.  She is followed by neurology and reports taking gabapentin for seizures.  She does not drive a vehicle.  She was feeling otherwise well prior to this event.  She denies any chest pain, heart palpitations, shortness of breath.   Past Medical History:  Diagnosis Date  . Allergy   . Anxiety   . Depression   . Diabetes mellitus    type 2  . Headache(784.0)   . Heart murmur    never caused any problems  . Hemorrhoid   . Hyperlipidemia   . Hypertension    dr  Revonda Humphrey  . Neuromuscular disorder ([place])    carpel tunnel left hand  . 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 Vaginitis, atrophic     Patient Active Problem List   Diagnosis Date Noted  . Episodic tension-type headache, not intractable 04/23/2017  . Acute posttraumatic stress disorder 06/24/2015  . GAD (generalized anxiety disorder) 06/24/2015  . 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 Chronic  daily headache 06/02/2015  . Neck pain 06/02/2015  . Closed fracture of left tibial plateau 05/06/2014  . Tibial plateau fracture 05/06/2014  . Bleeding external hemorrhoids 08/25/2012    Past Surgical History:  Procedure Laterality Date  . CESAREAN SECTION     x3  . COLONOSCOPY  05-07-2005  . EVALUATION UNDER ANESTHESIA WITH HEMORRHOIDECTOMY  09/19/2012   Procedure: EXAM UNDER ANESTHESIA WITH HEMORRHOIDECTOMY;  Surgeon: Harl Bowie, MD;  Location: [facility];  Service: General;  Laterality: N/A;  exam under anesthesia hemorrhiodectomy 1 -2 columns  . FINGER ARTHROSCOPY Right    pinky and 4th finger  . HYSTEROSCOPY W/D&C N/A 09/06/2016   Procedure: DILATATION AND CURETTAGE [place];  Surgeon: Thurnell Lose, MD;  Location: [facility];  Service: Gynecology;  Laterality: N/A;  POSSIBLE MYOSURE  . ORIF TIBIA PLATEAU Left 05/06/2014   Procedure: OPEN REDUCTION INTERNAL FIXATION (ORIF) LEFT TIBIAL PLATEAU;  Surgeon: Mcarthur Rossetti, MD;  Location: [facility];  Service: Orthopedics;  Laterality: Left;  . TUBAL LIGATION      Allergies Codeine  Family History  Problem Relation Age of Onset  . Heart disease Mother   . Cancer Father   . Colon cancer Neg Hx     Social History Social History   Tobacco Use  . Smoking status: Former Smoker    Packs/day: 1.00    Years:  20.00    Pack years: 20.00    Types: Cigarettes    Last attempt to quit: 12/25/1983    Years since quitting: 34.8  . Smokeless tobacco: Never Used  Substance Use Topics  . Alcohol use: No    Alcohol/week: 0.0 standard drinks  . Drug use: No    Review of Systems  Constitutional: No fever/chills Eyes: No visual changes. ENT: No sore throat. No tongue biting.  Cardiovascular: Denies chest pain. Respiratory: Denies shortness of breath. Gastrointestinal: No abdominal pain.  No nausea, no vomiting.  No diarrhea.  No constipation. Genitourinary: Negative for dysuria. No urine  incontinence.  Musculoskeletal: Negative for back pain. Positive left arm pain.  Skin: Negative for rash. Neurological: Negative for focal weakness or numbness. Positive HA. Positive seizure like activity.   10-point ROS otherwise negative.  ____________________________________________   PHYSICAL EXAM:  VITAL SIGNS: ED Triage Vitals  Enc Vitals Group     BP 11/03/18 1138 (!) 176/122     Pulse Rate 11/03/18 1138 85     Resp 11/03/18 1138 19     Temp 11/03/18 1138 98.2 F (36.8 C)     Temp Source 11/03/18 1138 Oral     SpO2 11/03/18 1138 100 %     Weight 11/03/18 1139 185 lb (83.9 kg)     Height 11/03/18 1139 5\' 2"  (1.575 m)     Pain Score 11/03/18 1139 10   Constitutional: Alert and oriented. Well appearing and in no acute distress. Eyes: Conjunctivae are normal. PERRL.  Head: Atraumatic. Mild tenderness over the right parietal scalp.  Nose: No congestion/rhinnorhea. Mouth/Throat: Mucous membranes are moist.  Neck: No stridor.  Cardiovascular: Normal rate, regular rhythm. Good peripheral circulation. Grossly normal heart sounds.   Respiratory: Normal respiratory effort.  No retractions. Lungs CTAB. Gastrointestinal: Soft and nontender. No distention.  Musculoskeletal: No lower extremity tenderness nor edema. No gross deformities of extremities. Normal active and passive ROM of the left shoulder, elbow, and wrist. No bony tenderness or rash/bruising.  Neurologic:  Normal speech and language. No gross focal neurologic deficits are appreciated.  Skin:  Skin is warm, dry and intact. No rash noted.  ____________________________________________   LABS (all labs ordered are listed, but only abnormal results are displayed)  Labs Reviewed  BASIC METABOLIC PANEL - Abnormal; Notable for the following components:      Result Value   Glucose, Bld 113 (*)    All other components within normal limits  CBC  URINALYSIS, ROUTINE W REFLEX MICROSCOPIC  CBG MONITORING, ED    ____________________________________________  EKG   EKG Interpretation  Date/Time:  Monday November 03 2018 11:31:58 EST Ventricular Rate:  84 PR Interval:  162 QRS Duration: 78 QT Interval:  376 QTC Calculation: 444 R Axis:   38 Text Interpretation:  Normal sinus rhythm Normal ECG Confirmed by Dene Gentry [phone]) on 11/03/2018 2:49:13 PM       ____________________________________________  RADIOLOGY  Ct Head Wo Contrast  Result Date: 11/03/2018 CLINICAL DATA:  63 year old female with history of seizure and fall with injury to the right side of the head. EXAM: CT HEAD WITHOUT CONTRAST TECHNIQUE: Contiguous axial images were obtained from the base of the skull through the vertex without intravenous contrast. COMPARISON:  Head CT 05/20/2015. FINDINGS: Brain: No evidence of acute infarction, hemorrhage, hydrocephalus, extra-axial collection or mass lesion/mass effect. Physiologic calcifications in the basal ganglia bilaterally incidentally noted. Vascular: No hyperdense vessel or unexpected calcification. Skull: Normal. Negative for fracture or focal lesion. Sinuses/Orbits:  No acute finding. Other: None. IMPRESSION: 1. No acute displaced skull fractures. No evidence of significant acute traumatic injury to the brain. Electronically Signed   By: Vinnie Langton M.D.   On: 11/03/2018 12:57    ____________________________________________   PROCEDURES  Procedure(s) performed:   Procedures  None ____________________________________________   INITIAL IMPRESSION / ASSESSMENT AND PLAN / ED COURSE  Pertinent labs & imaging results that were available during my care of the patient were reviewed by me and considered in my medical decision making (see chart for details).  Patient presents to the emergency department for evaluation of fall with head injury that appears mechanical.  There is no laceration.  Mild tenderness on exam.  CT ordered with no evidence of fracture or bleed.   Patient's lab work is unremarkable.  Patient had seizure-like activity that seems somewhat atypical and is consistent with her prior episodes.  She is followed by neurology.  She does not drive a vehicle.  I discussed with her that she cannot drive for at least 6 months and she understands.  Urinalysis is pending.   04:00 PM UA with no acute findings.  Plan for discharge with PCP and neurology follow-up.  ____________________________________________  FINAL CLINICAL IMPRESSION(S) / ED DIAGNOSES  Final diagnoses:  Injury of head, initial encounter  Seizure-like activity ([place])     MEDICATIONS GIVEN DURING THIS VISIT:  Medications  sodium chloride flush (NS) 0.9 % injection 3 mL (has no administration in time range)  ondansetron (ZOFRAN) injection 4 mg (has no administration in time range)  ondansetron (ZOFRAN-ODT) disintegrating tablet 4 mg (4 mg Oral Given 11/03/18 1147)  acetaminophen (TYLENOL) tablet 650 mg (650 mg Oral Given 11/03/18 1146)    Note:  This document was prepared using Dragon voice recognition software and may include unintentional dictation errors.  Nanda Quinton, MD Emergency Medicine    , Wonda Olds, MD 11/03/18 [phone]

## 2018-11-03 NOTE — ED Notes (Signed)
Patient ambulatory to restroom with steady gait and 1 person standby assist

## 2018-11-03 NOTE — Discharge Instructions (Signed)
You were seen in the Emergency Department (ED) today for a head injury.  Based on your evaluation, you may have sustained a concussion (or bruise) to your brain.  If you had a CT scan done, it did not show any evidence of serious injury or bleeding.    Symptoms to expect from a concussion include nausea, mild to moderate headache, difficulty concentrating or sleeping, and mild lightheadedness.  These symptoms should improve over the next few days to weeks, but it may take many weeks before you feel back to normal.  Return to the emergency department or follow-up with your primary care doctor if your symptoms are not improving over this time.  Signs of a more serious head injury include vomiting, severe headache, excessive sleepiness or confusion, and weakness or numbness in your face, arms or legs.  Return immediately to the Emergency Department if you experience any of these more concerning symptoms.    Rest, avoid strenuous physical or mental activity, and avoid activities that could potentially result in another head injury until all your symptoms from this head injury are completely resolved for at least 2-3 weeks.  You may take ibuprofen or acetaminophen over the counter according to label instructions for mild headache or scalp soreness.  

## 2018-11-10 ENCOUNTER — Telehealth: Payer: Self-pay | Admitting: Neurology

## 2018-11-10 NOTE — Telephone Encounter (Signed)
Patient is calling in wanting to speak with you. She said she wasn't sure what her next steps where supposed to be and if she needed to schedule an appt or have testing. Please call her back and advise. Thanks!

## 2018-11-10 NOTE — Telephone Encounter (Signed)
Patient had a seizure on last Monday and Heidi Lewis did go to the ED and they ran some test on her. Heidi Lewis wants to know if Dr Delice Lesch wants to see her or what Heidi Lewis needs to do now. Please call

## 2018-11-12 NOTE — Telephone Encounter (Signed)
LOV was in August.  No f/u scheduled.  I did not see any mention of her needing to see you soon.  pls advise.

## 2018-11-12 NOTE — Telephone Encounter (Signed)
Pls let her know I reviewed the ER notes and it sounds like they were her typical episodes with the stress seizures. Before this one, when was the last time she had one? Thanks

## 2019-05-07 ENCOUNTER — Other Ambulatory Visit: Payer: Self-pay | Admitting: Neurology

## 2019-05-07 DIAGNOSIS — R519 Headache, unspecified: Secondary | ICD-10-CM

## 2019-05-20 ENCOUNTER — Other Ambulatory Visit (HOSPITAL_COMMUNITY)
Admission: RE | Admit: 2019-05-20 | Discharge: 2019-05-20 | Disposition: A | Discharge status: Home / Self Care | Payer: BC Managed Care – PPO | Source: Ambulatory Visit | Attending: Internal Medicine | Admitting: Internal Medicine

## 2019-05-20 ENCOUNTER — Other Ambulatory Visit: Payer: Self-pay | Admitting: Internal Medicine

## 2019-05-20 DIAGNOSIS — Z124 Encounter for screening for malignant neoplasm of cervix: Secondary | ICD-10-CM | POA: Diagnosis present

## 2019-05-22 ENCOUNTER — Other Ambulatory Visit: Payer: Self-pay | Admitting: Internal Medicine

## 2019-05-22 DIAGNOSIS — Z1231 Encounter for screening mammogram for malignant neoplasm of breast: Secondary | ICD-10-CM

## 2019-05-22 LAB — CYTOLOGY - PAP: Diagnosis: NEGATIVE

## 2019-07-10 ENCOUNTER — Ambulatory Visit
Admission: RE | Admit: 2019-07-10 | Discharge: 2019-07-10 | Disposition: A | Discharge status: Home / Self Care | Payer: BC Managed Care – PPO | Source: Ambulatory Visit | Attending: Internal Medicine | Admitting: Internal Medicine

## 2019-07-10 ENCOUNTER — Other Ambulatory Visit: Payer: Self-pay

## 2019-07-10 DIAGNOSIS — Z1231 Encounter for screening mammogram for malignant neoplasm of breast: Secondary | ICD-10-CM

## 2019-10-08 ENCOUNTER — Ambulatory Visit
Admission: RE | Admit: 2019-10-08 | Discharge: 2019-10-08 | Disposition: A | Discharge status: Home / Self Care | Payer: BC Managed Care – PPO | Source: Ambulatory Visit | Attending: Internal Medicine | Admitting: Internal Medicine

## 2019-10-08 ENCOUNTER — Other Ambulatory Visit: Payer: Self-pay | Admitting: Internal Medicine

## 2019-10-08 DIAGNOSIS — M5410 Radiculopathy, site unspecified: Secondary | ICD-10-CM

## 2019-10-12 ENCOUNTER — Other Ambulatory Visit: Payer: Self-pay

## 2019-10-12 ENCOUNTER — Emergency Department (HOSPITAL_COMMUNITY)
Admission: EM | Admit: 2019-10-12 | Discharge: 2019-10-12 | Disposition: A | Discharge status: Home / Self Care | Payer: BC Managed Care – PPO | Attending: Emergency Medicine | Admitting: Emergency Medicine

## 2019-10-12 ENCOUNTER — Encounter (HOSPITAL_COMMUNITY): Payer: Self-pay | Admitting: Emergency Medicine

## 2019-10-12 DIAGNOSIS — Z7984 Long term (current) use of oral hypoglycemic drugs: Secondary | ICD-10-CM | POA: Diagnosis not present

## 2019-10-12 DIAGNOSIS — E119 Type 2 diabetes mellitus without complications: Secondary | ICD-10-CM | POA: Insufficient documentation

## 2019-10-12 DIAGNOSIS — Z7982 Long term (current) use of aspirin: Secondary | ICD-10-CM | POA: Diagnosis not present

## 2019-10-12 DIAGNOSIS — U071 COVID-19: Secondary | ICD-10-CM | POA: Insufficient documentation

## 2019-10-12 DIAGNOSIS — B349 Viral infection, unspecified: Secondary | ICD-10-CM

## 2019-10-12 DIAGNOSIS — Z87891 Personal history of nicotine dependence: Secondary | ICD-10-CM | POA: Insufficient documentation

## 2019-10-12 DIAGNOSIS — M7918 Myalgia, other site: Secondary | ICD-10-CM | POA: Diagnosis present

## 2019-10-12 DIAGNOSIS — I10 Essential (primary) hypertension: Secondary | ICD-10-CM | POA: Diagnosis not present

## 2019-10-12 DIAGNOSIS — Z79899 Other long term (current) drug therapy: Secondary | ICD-10-CM | POA: Insufficient documentation

## 2019-10-12 LAB — SARS CORONAVIRUS 2 (TAT 6-24 HRS): SARS Coronavirus 2: POSITIVE — AB

## 2019-10-12 MED ORDER — HYDROCODONE-ACETAMINOPHEN 5-325 MG PO TABS: 1.0000 | ORAL_TABLET | ORAL | 0 refills | Status: DC | PRN

## 2019-10-12 MED ORDER — KETOROLAC TROMETHAMINE 60 MG/2ML IM SOLN
60.0000 mg | Freq: Once | INTRAMUSCULAR | Status: AC
  Administered 2019-10-12: 60 mg via INTRAMUSCULAR
  Filled 2019-10-12: qty 2

## 2019-10-12 NOTE — ED Triage Notes (Signed)
Pt here from home with c/o general aches and pains was told that she had arthritis and was given meds by her primary MD but states that they are not helping

## 2019-10-12 NOTE — ED Notes (Signed)
Pt states has had loss of taste and smell over weekend, coughing on Saturday/sunday--

## 2019-10-12 NOTE — Discharge Instructions (Signed)
You have been tested for Covid.  Increase fluids.  Can take Tylenol for fever.  Prescription for pain medicine sent to your pharmacy.

## 2019-10-12 NOTE — ED Provider Notes (Signed)
[facility] EMERGENCY DEPARTMENT Provider Note   CSN: [id number] Arrival date & time: 10/12/19  [id number]     History Chief Complaint  Patient presents with  . Generalized Body Aches  . covid symptoms    Wynonna Oestreicher is a 64 y.o. female.  Generalized body aches for several days with associated low-grade fever.  Seen by her primary care doctor last Thursday and diagnosed with "arthritis".  She states loss of taste and smell over the weekend along with coughing.  She was prescribed Naprosyn and an antihistamine which have not helped much.  Severity of symptoms is mild to moderate.  Nothing makes symptoms better or worse.        Past Medical History:  Diagnosis Date  . Allergy   . Anxiety   . Depression   . Diabetes mellitus    type 2  . Headache(784.0)   . Heart murmur    never caused any problems  . Hemorrhoid   . Hyperlipidemia   . Hypertension    dr  Revonda Humphrey  . Neuromuscular disorder ([place])    carpel tunnel left hand  . 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 Vaginitis, atrophic     Patient Active Problem List   Diagnosis Date Noted  . Episodic tension-type headache, not intractable 04/23/2017  . Acute posttraumatic stress disorder 06/24/2015  . GAD (generalized anxiety disorder) 06/24/2015  . 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 Chronic daily headache 06/02/2015  . Neck pain 06/02/2015  . Closed fracture of left tibial plateau 05/06/2014  . Tibial plateau fracture 05/06/2014  . Bleeding external hemorrhoids 08/25/2012    Past Surgical History:  Procedure Laterality Date  . CESAREAN SECTION     x3  . COLONOSCOPY  05-07-2005  . EVALUATION UNDER ANESTHESIA WITH HEMORRHOIDECTOMY  09/19/2012   Procedure: EXAM UNDER ANESTHESIA WITH HEMORRHOIDECTOMY;  Surgeon: Harl Bowie, MD;  Location: [facility];  Service: General;  Laterality: N/A;  exam under anesthesia  hemorrhiodectomy 1 -2 columns  . FINGER ARTHROSCOPY Right    pinky and 4th finger  . HYSTEROSCOPY WITH D & C N/A 09/06/2016   Procedure: DILATATION AND CURETTAGE [place] [facility];  Surgeon: Thurnell Lose, MD;  Location: [facility];  Service: Gynecology;  Laterality: N/A;  POSSIBLE MYOSURE  . ORIF TIBIA PLATEAU Left 05/06/2014   Procedure: OPEN REDUCTION INTERNAL FIXATION (ORIF) LEFT TIBIAL PLATEAU;  Surgeon: Mcarthur Rossetti, MD;  Location: [facility];  Service: Orthopedics;  Laterality: Left;  . TUBAL LIGATION       OB History   No obstetric history on file.     Family History  Problem Relation Age of Onset  . Heart disease Mother   . Cancer Father   . Colon cancer Neg Hx     Social History   Tobacco Use  . Smoking status: Former Smoker    Packs/day: 1.00    Years: 20.00    Pack years: 20.00    Types: Cigarettes    Quit date: 12/25/1983    Years since quitting: 35.8  . Smokeless tobacco: Never Used  Substance Use Topics  . Alcohol use: No    Alcohol/week: 0.0 standard drinks  . Drug use: No    Home Medications Prior to Admission medications   Medication Sig Start Date End Date Taking? Authorizing Provider  acetaminophen (TYLENOL) 500 MG tablet Take 1,000 mg by mouth every 6 (six) hours as  needed for moderate pain or headache.     [provider]  amitriptyline (ELAVIL) 25 MG tablet Take 1 tablet every night 05/26/18   Cameron Sprang, MD  aspirin EC 81 MG tablet Take 81 mg by mouth daily.    [provider]  atorvastatin (LIPITOR) 10 MG tablet Take 10 mg by mouth 3 (three) times daily - between meals and at bedtime. Take 1 tablet daily 10/04/15   [provider]  citalopram (CELEXA) 10 MG tablet Take 10 mg by mouth daily. Take 1 tablet daily 10/04/15   [provider]  clobetasol ointment (TEMOVATE) [id number] % Apply 1 application topically daily. 04/08/17   [provider]  clotrimazole-betamethasone  (LOTRISONE) cream Apply 1 application topically as directed. As needed for rash.    [provider]  escitalopram (LEXAPRO) 10 MG tablet Take 10 mg by mouth daily. 06/11/17   [provider]  gabapentin (NEURONTIN) 100 MG capsule Take 100 mg by mouth at bedtime.    [provider]  HYDROcodone-acetaminophen (NORCO/VICODIN) 5-325 MG tablet Take 1 tablet by mouth every 4 (four) hours as needed. One tab q4h prn pain 10/12/19   Nat Christen, MD  hydrOXYzine (ATARAX/VISTARIL) 25 MG tablet Take 25 mg by mouth daily. 06/11/17   [provider]  ibuprofen (ADVIL,MOTRIN) 600 MG tablet Take 1 tablet (600 mg total) by mouth every 6 (six) hours as needed. 09/06/16   Thurnell Lose, MD  loratadine (CLARITIN) 10 MG tablet Take 10 mg by mouth daily as needed for allergies.    [provider]  losartan (COZAAR) 50 MG tablet Take 50 mg by mouth every morning.     [provider]  metFORMIN (GLUCOPHAGE) 500 MG tablet Take 500 mg by mouth daily with breakfast.     [provider]  tiZANidine (ZANAFLEX) 4 MG tablet Take 1 tablet (4 mg total) by mouth every 8 (eight) hours as needed for muscle spasms (neck pain). 07/05/15   Cameron Sprang, MD  triamcinolone cream (KENALOG) 0.1 % Apply 1 application topically daily. 06/11/17   [provider]    Allergies    Codeine  Review of Systems   Review of Systems  All other systems reviewed and are negative.   Physical Exam Updated Vital Signs BP (!) 148/89 (BP Location: Right Arm)   Pulse 89   Temp (S) 100.1 F (37.8 C) (Oral)   Resp 16   SpO2 97%   Physical Exam Vitals and nursing note reviewed.  Constitutional:      Appearance: She is well-developed.  HENT:     Head: Normocephalic and atraumatic.  Eyes:     Conjunctiva/sclera: Conjunctivae normal.  Cardiovascular:     Rate and Rhythm: Normal rate and regular rhythm.  Pulmonary:     Effort: Pulmonary effort is normal.     Breath sounds:  Normal breath sounds.  Abdominal:     General: Bowel sounds are normal.     Palpations: Abdomen is soft.  Musculoskeletal:        General: Normal range of motion.     Cervical back: Neck supple.     Comments: Minimal pain with range of motion of joints.  Skin:    General: Skin is warm and dry.  Neurological:     General: No focal deficit present.     Mental Status: She is alert and oriented to person, place, and time.  Psychiatric:        Behavior: Behavior normal.  ED Results / Procedures / Treatments   Labs (all labs ordered are listed, but only abnormal results are displayed) Labs Reviewed  SARS CORONAVIRUS 2 (TAT 6-24 HRS)    EKG None  Radiology No results found.  Procedures Procedures (including critical care time)  Medications Ordered in ED Medications  ketorolac (TORADOL) injection 60 mg (has no administration in time range)    ED Course  I have reviewed the triage vital signs and the nursing notes.  Pertinent labs & imaging results that were available during my care of the patient were reviewed by me and considered in my medical decision making (see chart for details).    MDM Rules/Calculators/A&P                      Suspect Covid diagnosis.  Swab obtained.  Toradol 60 mg IM.  Stable for outpatient management.  Discharge medication Vicodin No. 15.   Sherleen Panciera was evaluated in Emergency Department on 10/12/2019 for the symptoms described in the history of present illness. She was evaluated in the context of the global COVID-19 pandemic, which necessitated consideration that the patient might be at risk for infection with the SARS-CoV-2 virus that causes COVID-19. Institutional protocols and algorithms that pertain to the evaluation of patients at risk for COVID-19 are in a state of rapid change based on information released by regulatory bodies including the CDC and federal and state organizations. These policies and algorithms were followed  during the patient's care in the ED. Final Clinical Impression(s) / ED Diagnoses Final diagnoses:  Viral syndrome    Rx / DC Orders ED Discharge Orders         Ordered    HYDROcodone-acetaminophen (NORCO/VICODIN) 5-325 MG tablet  Every 4 hours PRN     10/12/19 0902           Nat Christen, MD 10/12/19 [phone]

## 2019-10-13 ENCOUNTER — Telehealth: Payer: Self-pay | Admitting: Nurse Practitioner

## 2019-10-13 NOTE — Telephone Encounter (Signed)
Called to Discuss with patient about Covid symptoms and the use of bamlanivimab, a monoclonal antibody infusion for those with mild to moderate Covid symptoms and at a high risk of hospitalization.     Pt is qualified for this infusion at the [facility] center due to co-morbid conditions and/or a member of an at-risk group.     Unable to reach pt  

## 2019-10-23 ENCOUNTER — Telehealth (HOSPITAL_COMMUNITY): Payer: Self-pay

## 2019-11-06 ENCOUNTER — Telehealth: Payer: Self-pay | Admitting: *Deleted

## 2019-11-06 NOTE — Telephone Encounter (Signed)
Called regarding getting retested for one of her jobs. She had a positive test done 3 weeks ago. No symptoms now and had been in quarantine. Appointment scheduled Monday 11/09/2019 for pt as requested.

## 2019-11-09 ENCOUNTER — Ambulatory Visit: Payer: BC Managed Care – PPO | Attending: Internal Medicine

## 2019-11-09 DIAGNOSIS — Z20822 Contact with and (suspected) exposure to covid-19: Secondary | ICD-10-CM

## 2019-11-10 LAB — NOVEL CORONAVIRUS, NAA: SARS-CoV-2, NAA: NOT DETECTED

## 2020-08-29 ENCOUNTER — Other Ambulatory Visit: Payer: Self-pay | Admitting: Internal Medicine

## 2020-08-29 DIAGNOSIS — Z1231 Encounter for screening mammogram for malignant neoplasm of breast: Secondary | ICD-10-CM

## 2020-10-11 ENCOUNTER — Ambulatory Visit: Payer: BC Managed Care – PPO

## 2020-10-12 ENCOUNTER — Other Ambulatory Visit: Payer: Self-pay

## 2020-10-12 ENCOUNTER — Ambulatory Visit
Admission: RE | Admit: 2020-10-12 | Discharge: 2020-10-12 | Disposition: A | Discharge status: Home / Self Care | Payer: BC Managed Care – PPO | Source: Ambulatory Visit | Attending: Internal Medicine | Admitting: Internal Medicine

## 2020-10-12 DIAGNOSIS — Z1231 Encounter for screening mammogram for malignant neoplasm of breast: Secondary | ICD-10-CM

## 2020-11-17 ENCOUNTER — Ambulatory Visit: Payer: Self-pay

## 2020-12-14 ENCOUNTER — Ambulatory Visit: Payer: Self-pay

## 2020-12-14 ENCOUNTER — Other Ambulatory Visit: Payer: Self-pay

## 2020-12-14 ENCOUNTER — Ambulatory Visit (INDEPENDENT_AMBULATORY_CARE_PROVIDER_SITE_OTHER): Payer: BC Managed Care – PPO | Admitting: Physician Assistant

## 2020-12-14 ENCOUNTER — Encounter: Payer: Self-pay | Admitting: Physician Assistant

## 2020-12-14 DIAGNOSIS — M79605 Pain in left leg: Secondary | ICD-10-CM | POA: Diagnosis not present

## 2020-12-14 DIAGNOSIS — J301 Allergic rhinitis due to pollen: Secondary | ICD-10-CM | POA: Insufficient documentation

## 2020-12-14 DIAGNOSIS — E785 Hyperlipidemia, unspecified: Secondary | ICD-10-CM | POA: Insufficient documentation

## 2020-12-14 DIAGNOSIS — G8929 Other chronic pain: Secondary | ICD-10-CM

## 2020-12-14 DIAGNOSIS — E1169 Type 2 diabetes mellitus with other specified complication: Secondary | ICD-10-CM | POA: Insufficient documentation

## 2020-12-14 DIAGNOSIS — M25562 Pain in left knee: Secondary | ICD-10-CM

## 2020-12-14 DIAGNOSIS — F4322 Adjustment disorder with anxiety: Secondary | ICD-10-CM | POA: Insufficient documentation

## 2020-12-14 DIAGNOSIS — Z6837 Body mass index (BMI) 37.0-37.9, adult: Secondary | ICD-10-CM | POA: Insufficient documentation

## 2020-12-14 DIAGNOSIS — L209 Atopic dermatitis, unspecified: Secondary | ICD-10-CM | POA: Insufficient documentation

## 2020-12-14 DIAGNOSIS — E1142 Type 2 diabetes mellitus with diabetic polyneuropathy: Secondary | ICD-10-CM | POA: Insufficient documentation

## 2020-12-14 DIAGNOSIS — Z780 Asymptomatic menopausal state: Secondary | ICD-10-CM | POA: Insufficient documentation

## 2020-12-14 DIAGNOSIS — I10 Essential (primary) hypertension: Secondary | ICD-10-CM | POA: Insufficient documentation

## 2020-12-14 NOTE — Progress Notes (Signed)
Office Visit Note   Patient: Heidi Lewis           Date of Birth: November 08, 1955           MRN: [id number] Visit Date: 12/14/2020              Requested by: Leeroy Cha, MD [address] [place],  [address] [address] PCP: Leeroy Cha, MD   Assessment & Plan: Visit Diagnoses:  1. Pain in left leg   2. Chronic pain of left knee     Plan: Offered reassurance that radiographs showed that the hardware was without any signs of failure.  Explained to her that this most likely is due to the early arthritic changes that are seen on radiographs involving the left knee.  Recommended quad strengthening exercises, weight loss and possible steroid injection.  She would like to try strengthening the knee losing weight.  She defers the injection.  She may benefit from cortisone injection and possibly a supplemental injection in the future.  Have her apply Voltaren gel to the knee up to 4 g 4 times daily.  She will follow-up with [place] as needed.  Questions were encouraged and answered at length.  Follow-Up Instructions: Return if symptoms worsen or fail to improve.   Orders:  Orders Placed This Encounter  Procedures  . XR Knee 1-2 Views Left  . XR Tibia/Fibula Left   No orders of the defined types were placed in this encounter.     Procedures: No procedures performed   Clinical Data: No additional findings.   Subjective: Chief Complaint  Patient presents with  . Left Knee - Pain    HPI Heidi Lewis is a 65 year old female who is known to Dr. Ninfa Linden service we have not seen her in multiple years.  She did undergo a left open reduction internal fixation bicondylar tibial plateau fracture 05/06/2014.  She comes in today due to left knee pain anterior tibia.  Pain is been worse over the last year.  She is used over-the-counter creams.  She does feel some weakness but no true giving way, locking catching or painful popping.  She takes Tylenol for the pain.   Pain is mostly anterior proximal tibia and knee. Review of Systems See HPI otherwise negative  Objective: Vital Signs: There were no vitals taken for this visit.  Physical Exam General: Well-developed well-nourished female no acute distress ambulates without any assistive device. Psych: Alert and oriented x3 Ortho Exam Bilateral knees good range of motion without pain.  She has slight patellofemoral crepitus left knee with passive range of motion.  No instability valgus varus stressing of either knee.  Left knee without any abnormal warmth erythema or effusion.  Tenderness left knee along the medial joint line.  Surgical incision left knee is well-healed no signs of infection. Specialty Comments:  No specialty comments available.  Imaging: XR Knee 1-2 Views Left  Result Date: 12/14/2020 Left knee: No acute fracture.  No hardware failure.  Mild tricompartmental arthritic changes.  No bony abnormalities otherwise.  XR Tibia/Fibula Left  Result Date: 12/14/2020 Left tibia 2 views shows patient be status post open reduction internal fixation tibial plateau fracture with retained hardware.  No hardware failure.  Knee and ankle are well located.  No acute findings.    PMFS History: Patient Active Problem List   Diagnosis Date Noted  . Adjustment disorder with anxiety 12/14/2020  . Allergic rhinitis due to pollen 12/14/2020  . Atopic dermatitis 12/14/2020  .  Body mass index (BMI) 37.0-37.9, adult 12/14/2020  . Type 2 diabetes mellitus with other specified complication ([place]) 82/95/6213  . Dyslipidemia 12/14/2020  . Hypertension 12/14/2020  . Menopause 12/14/2020  . Polyneuropathy due to type 2 diabetes mellitus ([place]) 12/14/2020  . Pain in thumb joint with movement, left 09/03/2017  . Episodic tension-type headache, not intractable 04/23/2017  . Carpal tunnel syndrome of left wrist 05/18/2016  . Left hand pain 05/18/2016  . Acute posttraumatic stress disorder 06/24/2015  . GAD  (generalized anxiety disorder) 06/24/2015  . 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 Chronic daily headache 06/02/2015  . Neck pain 06/02/2015  . Closed fracture of left tibial plateau 05/06/2014  . Tibial plateau fracture 05/06/2014  . Bleeding external hemorrhoids 08/25/2012   Past Medical History:  Diagnosis Date  . Allergy   . Anxiety   . Depression   . Diabetes mellitus    type 2  . Headache(784.0)   . Heart murmur    never caused any problems  . Hemorrhoid   . Hyperlipidemia   . Hypertension    dr  Revonda Lewis  . Neuromuscular disorder ([place])    carpel tunnel left hand  . 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 Vaginitis, atrophic     Family History  Problem Relation Age of Onset  . Heart disease Mother   . Cancer Father   . Colon cancer Neg Hx     Past Surgical History:  Procedure Laterality Date  . CESAREAN SECTION     x3  . COLONOSCOPY  05-07-2005  . EVALUATION UNDER ANESTHESIA WITH HEMORRHOIDECTOMY  09/19/2012   Procedure: EXAM UNDER ANESTHESIA WITH HEMORRHOIDECTOMY;  Surgeon: Harl Bowie, MD;  Location: [facility];  Service: General;  Laterality: N/A;  exam under anesthesia hemorrhiodectomy 1 -2 columns  . FINGER ARTHROSCOPY Right    pinky and 4th finger  . HYSTEROSCOPY WITH D & C N/A 09/06/2016   Procedure: DILATATION AND CURETTAGE [place];  Surgeon: Thurnell Lose, MD;  Location: [facility];  Service: Gynecology;  Laterality: N/A;  POSSIBLE MYOSURE  . ORIF TIBIA PLATEAU Left 05/06/2014   Procedure: OPEN REDUCTION INTERNAL FIXATION (ORIF) LEFT TIBIAL PLATEAU;  Surgeon: Mcarthur Rossetti, MD;  Location: [facility];  Service: Orthopedics;  Laterality: Left;  . TUBAL LIGATION     Social History   Occupational History  . Not on file  Tobacco Use  . Smoking status: Former Smoker    Packs/day: 1.00    Years: 20.00    Pack years: 20.00    Types:  Cigarettes    Quit date: 12/25/1983    Years since quitting: 36.9  . Smokeless tobacco: Never Used  Substance and Sexual Activity  . Alcohol use: No    Alcohol/week: 0.0 standard drinks  . Drug use: No  . Sexual activity: Yes    Birth control/protection: Post-menopausal, Surgical

## 2021-09-07 ENCOUNTER — Other Ambulatory Visit: Payer: Self-pay | Admitting: Internal Medicine

## 2021-09-07 DIAGNOSIS — Z1231 Encounter for screening mammogram for malignant neoplasm of breast: Secondary | ICD-10-CM

## 2021-10-17 ENCOUNTER — Ambulatory Visit
Admission: RE | Admit: 2021-10-17 | Discharge: 2021-10-17 | Disposition: A | Discharge status: Home / Self Care | Payer: BC Managed Care – PPO | Source: Ambulatory Visit | Attending: Internal Medicine | Admitting: Internal Medicine

## 2021-10-17 DIAGNOSIS — Z1231 Encounter for screening mammogram for malignant neoplasm of breast: Secondary | ICD-10-CM

## 2022-09-13 IMAGING — MG MM DIGITAL SCREENING BILAT W/ TOMO AND CAD
8 series · 8 of 24 positions shown · non-contrast
Comparison: Previous exam(s).

CLINICAL DATA: Screening.

EXAM:
DIGITAL SCREENING BILATERAL MAMMOGRAM WITH TOMOSYNTHESIS AND CAD
TECHNIQUE: Bilateral screening digital craniocaudal and mediolateral oblique
mammograms were obtained. Bilateral screening digital breast
tomosynthesis was performed. The images were evaluated with
computer-aided detection.

[R CC synth-2D]
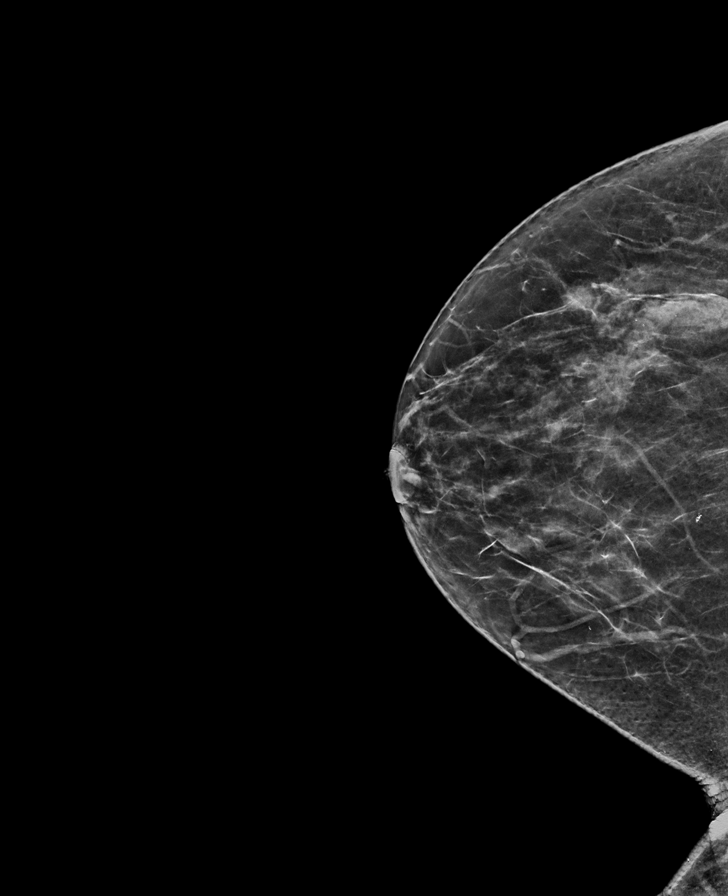

[L MLO synth-2D]
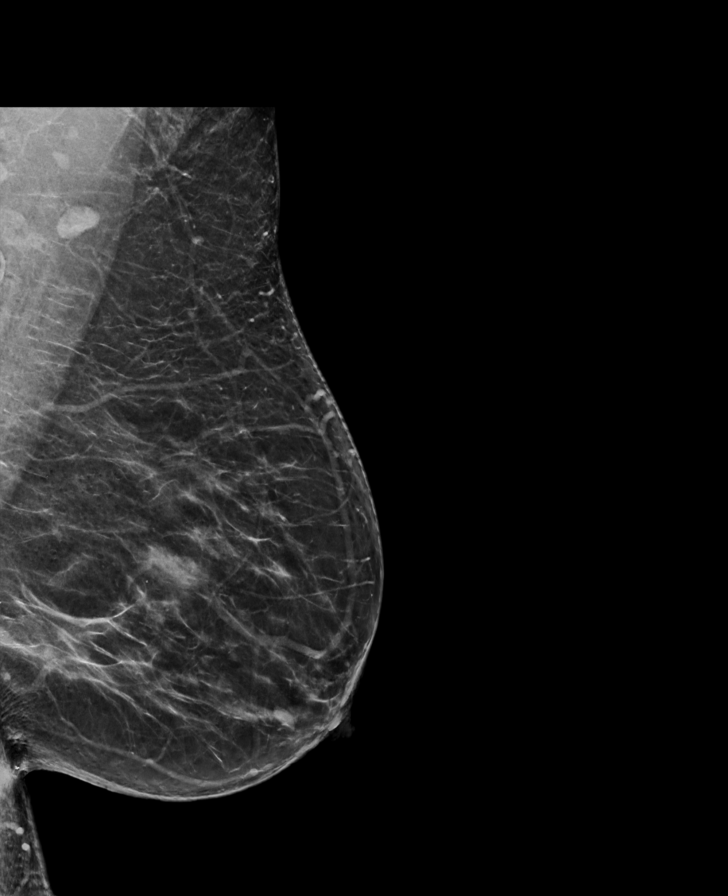

[L CC synth-2D]
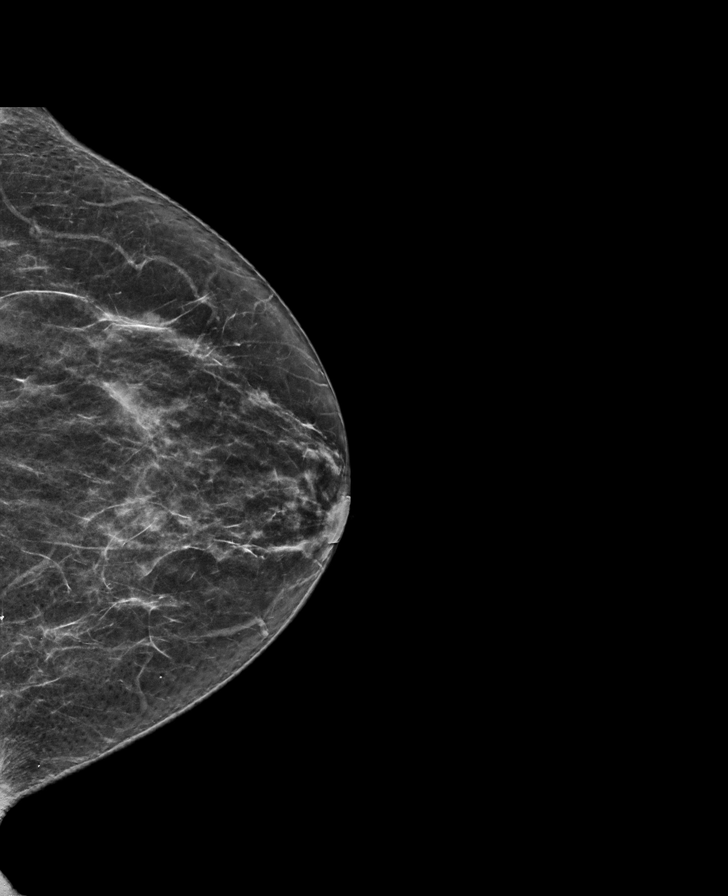

[R MLO synth-2D]
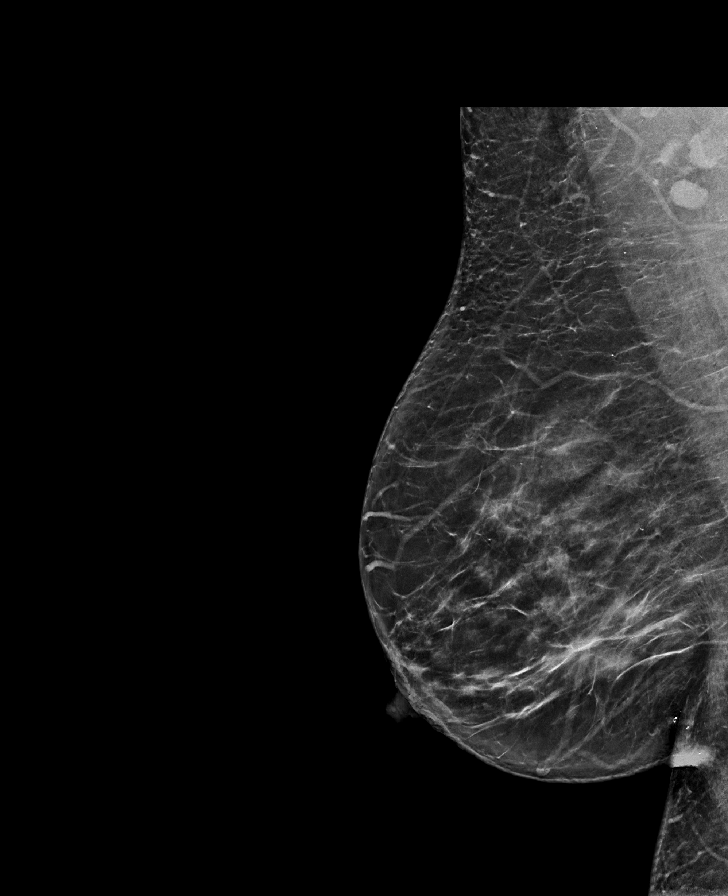

[R MLO tomo · tomo slice 39/77.0]
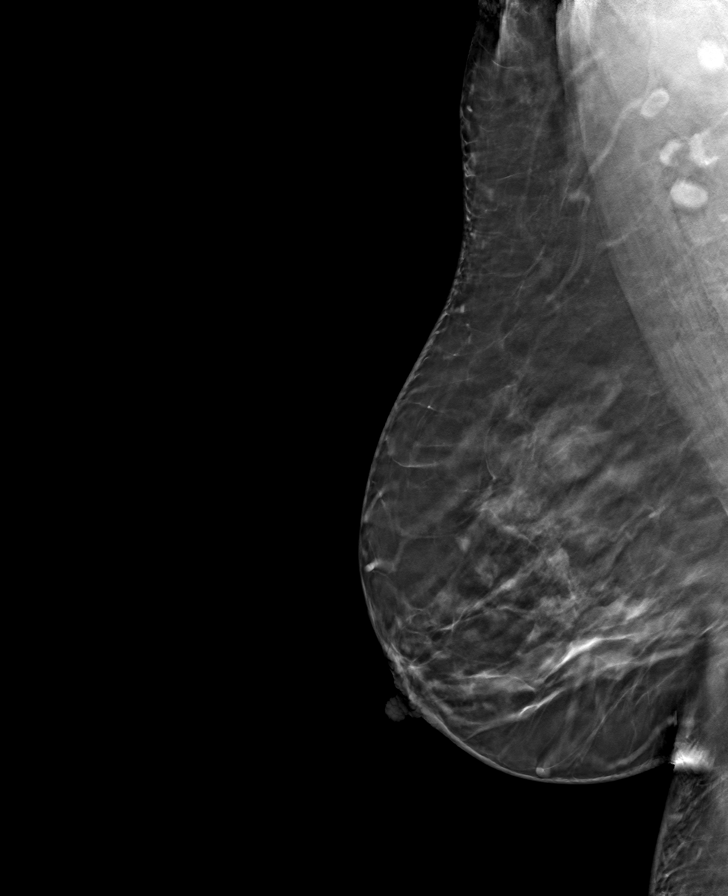

[L MLO tomo · tomo slice 39/77.0]
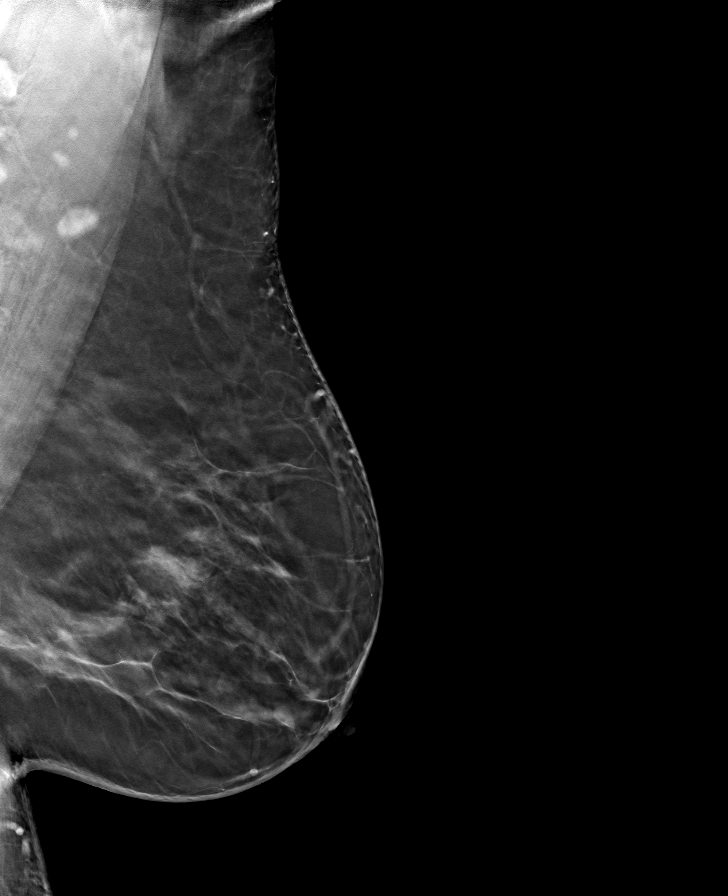

[L CC tomo · tomo slice 35/68.0]
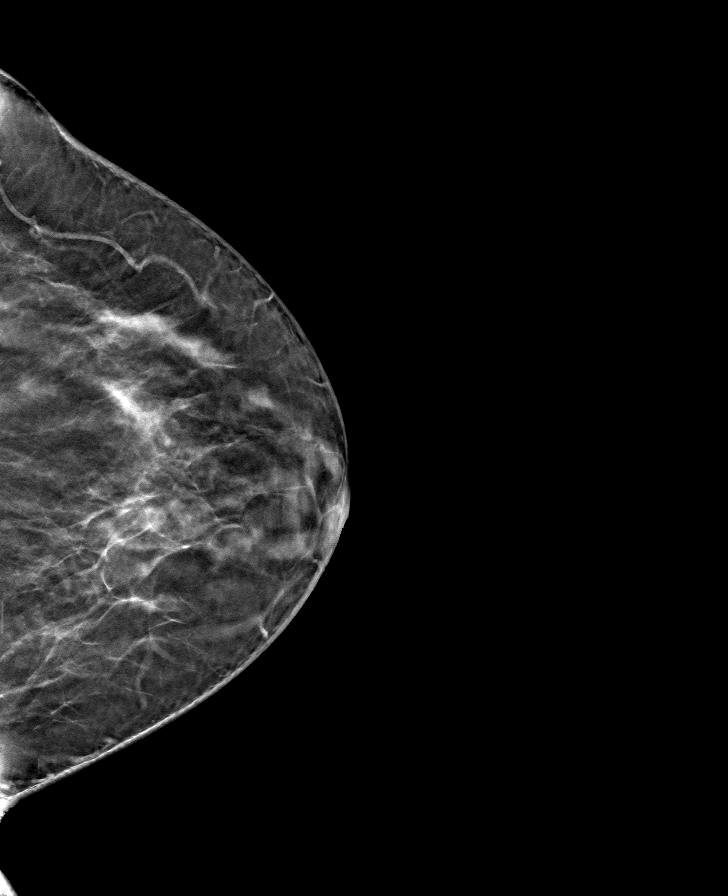

[R CC tomo · tomo slice 33/65.0]
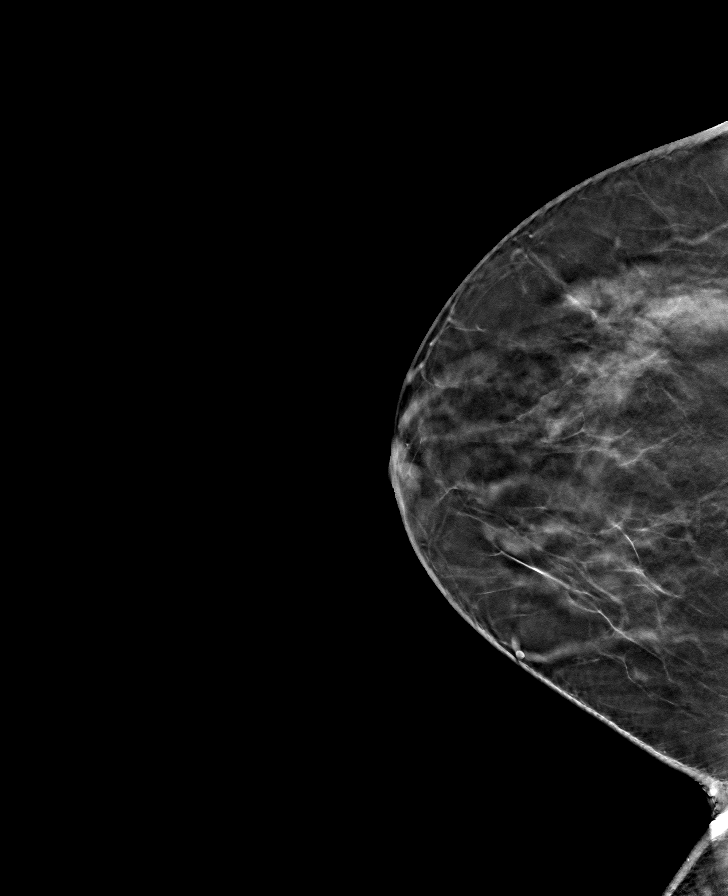

[8 of 24 positions shown; findings below may reference images not displayed]

ACR Breast Density Category c: The breast tissue is heterogeneously
dense, which may obscure small masses.
FINDINGS: There are no findings suspicious for malignancy.
IMPRESSION: No mammographic evidence of malignancy. A result letter of this
screening mammogram will be mailed directly to the patient.

RECOMMENDATION:
Screening mammogram in one year. (Code:Q3-W-BC3)

BI-RADS CATEGORY  1: Negative.

## 2022-10-29 ENCOUNTER — Other Ambulatory Visit (HOSPITAL_COMMUNITY)
Admission: RE | Admit: 2022-10-29 | Discharge: 2022-10-29 | Disposition: A | Discharge status: Home / Self Care | Payer: BC Managed Care – PPO | Source: Ambulatory Visit | Attending: Nurse Practitioner | Admitting: Nurse Practitioner

## 2022-10-29 ENCOUNTER — Other Ambulatory Visit: Payer: Self-pay | Admitting: Internal Medicine

## 2022-10-29 ENCOUNTER — Other Ambulatory Visit: Payer: Self-pay | Admitting: Nurse Practitioner

## 2022-10-29 DIAGNOSIS — Z1231 Encounter for screening mammogram for malignant neoplasm of breast: Secondary | ICD-10-CM

## 2022-10-29 DIAGNOSIS — Z124 Encounter for screening for malignant neoplasm of cervix: Secondary | ICD-10-CM | POA: Diagnosis present

## 2022-10-31 LAB — CYTOLOGY - PAP
Comment: NEGATIVE
Diagnosis: NEGATIVE
High risk HPV: NEGATIVE

## 2022-11-01 ENCOUNTER — Ambulatory Visit
Admission: RE | Admit: 2022-11-01 | Discharge: 2022-11-01 | Disposition: A | Discharge status: Home / Self Care | Payer: BC Managed Care – PPO | Source: Ambulatory Visit | Attending: Internal Medicine | Admitting: Internal Medicine

## 2022-11-01 DIAGNOSIS — Z1231 Encounter for screening mammogram for malignant neoplasm of breast: Secondary | ICD-10-CM

## 2023-08-17 ENCOUNTER — Ambulatory Visit
Admission: EM | Admit: 2023-08-17 | Discharge: 2023-08-17 | Disposition: A | Discharge status: Home / Self Care | Payer: BC Managed Care – PPO | Attending: Internal Medicine | Admitting: Internal Medicine

## 2023-08-17 DIAGNOSIS — M5442 Lumbago with sciatica, left side: Secondary | ICD-10-CM | POA: Diagnosis not present

## 2023-08-17 MED ORDER — TIZANIDINE HCL 2 MG PO TABS: 2.0000 mg | ORAL_TABLET | Freq: Two times a day (BID) | ORAL | 0 refills | Status: AC | PRN

## 2023-08-17 MED ORDER — DEXAMETHASONE SODIUM PHOSPHATE 10 MG/ML IJ SOLN
10.0000 mg | Freq: Once | INTRAMUSCULAR | Status: AC
Start: 2023-08-17 — End: 2023-08-17
  Administered 2023-08-17: 10 mg via INTRAMUSCULAR

## 2023-08-17 NOTE — Discharge Instructions (Signed)
You were given a steroid shot today in urgent care to decrease inflammation as well as a muscle relaxer to take as needed.  Please be advised that muscle relaxer can make you drowsy so do not drive or drink alcohol with taking it.  Follow-up if symptoms persist or worsen.

## 2023-08-17 NOTE — ED Triage Notes (Signed)
X3 weeks Pt states that she has back pain that radiates down her left leg.

## 2023-08-17 NOTE — ED Provider Notes (Addendum)
[facility]    CSN: [id number] Arrival date & time: 08/17/23  1328      History   Chief Complaint Chief Complaint  Patient presents with   Back Pain    HPI Heidi Lewis is a 67 y.o. female.   Patient presents with 3 week history of lower back pain that radiates down her left leg.  Denies any injury but reports that she has a previous history of intermittent lower back pain.  States this is the first time she has been evaluated since symptoms started.  Denies urinary frequency, urinary or bowel incontinence, saddle anesthesia.  Has taken Tylenol with minimal improvement.   Back Pain   Past Medical History:  Diagnosis Date   Allergy    Anxiety    Depression    Diabetes mellitus    type 2   Headache(784.0)    Heart murmur    never caused any problems   Hemorrhoid    Hyperlipidemia    Hypertension    dr  Cecil Cranker   Neuromuscular disorder [facility])    carpel tunnel left hand   Seizures ([facility])    Last seizure 05/2016. psychogenic non-epileptic events with normal EEG   Vaginitis, atrophic     Patient Active Problem List   Diagnosis Date Noted   Adjustment disorder with anxiety 12/14/2020   Allergic rhinitis due to pollen 12/14/2020   Atopic dermatitis 12/14/2020   Body mass index (BMI) 37.0-37.9, adult 12/14/2020   Type 2 diabetes mellitus with other specified complication ([facility]) 12/14/2020   Dyslipidemia 12/14/2020   Hypertension 12/14/2020   Menopause 12/14/2020   Polyneuropathy due to type 2 diabetes mellitus ([facility]) 12/14/2020   Pain in thumb joint with movement, left 09/03/2017   Episodic tension-type headache, not intractable 04/23/2017   Carpal tunnel syndrome of left wrist 05/18/2016   Left hand pain 05/18/2016   Acute posttraumatic stress disorder 06/24/2015   GAD (generalized anxiety disorder) 06/24/2015   Conversion disorder with seizures or convulsions 06/02/2015   Chronic daily headache 06/02/2015   Neck pain 06/02/2015   Closed  fracture of left tibial plateau 05/06/2014   Tibial plateau fracture 05/06/2014   Bleeding external hemorrhoids 08/25/2012    Past Surgical History:  Procedure Laterality Date   CESAREAN SECTION     x3   COLONOSCOPY  05-07-2005   EVALUATION UNDER ANESTHESIA WITH HEMORRHOIDECTOMY  09/19/2012   Procedure: EXAM UNDER ANESTHESIA WITH HEMORRHOIDECTOMY;  Surgeon: Shelly Rubenstein, MD;  Location: [facility];  Service: General;  Laterality: N/A;  exam under anesthesia hemorrhiodectomy 1 -2 columns   FINGER ARTHROSCOPY Right    pinky and 4th finger   HYSTEROSCOPY WITH D & C N/A 09/06/2016   Procedure: DILATATION AND CURETTAGE Fredrich Romans;  Surgeon: Geryl Rankins, MD;  Location: [facility];  Service: Gynecology;  Laterality: N/A;  POSSIBLE MYOSURE   ORIF TIBIA PLATEAU Left 05/06/2014   Procedure: OPEN REDUCTION INTERNAL FIXATION (ORIF) LEFT TIBIAL PLATEAU;  Surgeon: Kathryne Hitch, MD;  Location: [facility];  Service: Orthopedics;  Laterality: Left;   TUBAL LIGATION      OB History   No obstetric history on file.      Home Medications    Prior to Admission medications   Medication Sig Start Date End Date Taking? Authorizing Provider  amitriptyline (ELAVIL) 25 MG tablet Take 1 tablet every night 05/26/18  Yes Van Clines, MD  aspirin EC 81 MG tablet Take 81 mg by mouth daily.   Yes  [provider]  atorvastatin (LIPITOR) 10 MG tablet Take 10 mg by mouth 3 (three) times daily - between meals and at bedtime. Take 1 tablet daily 10/04/15  Yes [provider]  citalopram (CELEXA) 10 MG tablet Take 10 mg by mouth daily. Take 1 tablet daily 10/04/15  Yes [provider]  clotrimazole-betamethasone (LOTRISONE) cream Apply 1 application topically as directed. As needed for rash.   Yes [provider]  hydrochlorothiazide (HYDRODIURIL) 12.5 MG tablet Take 12.5 mg by mouth every morning. 09/01/20  Yes [provider]  losartan  (COZAAR) 50 MG tablet Take 50 mg by mouth every morning.    Yes [provider]  tiZANidine (ZANAFLEX) 2 MG tablet Take 1 tablet (2 mg total) by mouth every 12 (twelve) hours as needed for muscle spasms. 08/17/23  Yes Aayush Gelpi, Rolly Salter E, FNP  acetaminophen (TYLENOL) 500 MG tablet Take 1,000 mg by mouth every 6 (six) hours as needed for moderate pain or headache.     [provider]  clobetasol ointment (TEMOVATE) 0.05 % Apply 1 application topically daily. 04/08/17   [provider]  clotrimazole (CLOTRIMAZOLE AF) 1 % cream 1 application 08/06/17   [provider]  escitalopram (LEXAPRO) 10 MG tablet Take 10 mg by mouth daily. 06/11/17   [provider]  ibuprofen (ADVIL,MOTRIN) 600 MG tablet Take 1 tablet (600 mg total) by mouth every 6 (six) hours as needed. 09/06/16   Geryl Rankins, MD  loratadine (CLARITIN) 10 MG tablet Take 10 mg by mouth daily as needed for allergies.    [provider]  metFORMIN (GLUCOPHAGE) 500 MG tablet Take 500 mg by mouth daily with breakfast.     [provider]  terbinafine (LAMISIL AT) 1 % cream 1 application 05/20/19   [provider]    Family History Family History  Problem Relation Age of Onset   Heart disease Mother    Cancer Father    Colon cancer Neg Hx     Social History Social History   Tobacco Use   Smoking status: Former    Current packs/day: 0.00    Average packs/day: 1 pack/day for 20.0 years (20.0 ttl pk-yrs)    Types: Cigarettes    Start date: 12/25/1963    Quit date: 12/25/1983    Years since quitting: 39.6   Smokeless tobacco: Never  Vaping Use   Vaping status: Never Used  Substance Use Topics   Alcohol use: No    Alcohol/week: 0.0 standard drinks of alcohol   Drug use: No     Allergies   Fluconazole and Codeine   Review of Systems Review of Systems Per HPI  Physical Exam Triage Vital Signs ED Triage Vitals  Encounter Vitals Group     BP 08/17/23 1525  129/85     Systolic BP Percentile --      Diastolic BP Percentile --      Pulse Rate 08/17/23 1525 78     Resp 08/17/23 1525 17     Temp 08/17/23 1525 98 F (36.7 C)     Temp Source 08/17/23 1525 Oral     SpO2 08/17/23 1525 95 %     Weight 08/17/23 1522 175 lb (79.4 kg)     Height 08/17/23 1522 5\' 2"  (1.575 m)     Head Circumference --      Peak Flow --      Pain Score 08/17/23 1522 9     Pain Loc --  Pain Education --      Exclude from Growth Chart --    No data found.  Updated Vital Signs BP 129/85 (BP Location: Right Arm)   Pulse 78   Temp 98 F (36.7 C) (Oral)   Resp 17   Ht 5\' 2"  (1.575 m)   Wt 175 lb (79.4 kg)   SpO2 95%   BMI 32.01 kg/m   Visual Acuity Right Eye Distance:   Left Eye Distance:   Bilateral Distance:    Right Eye Near:   Left Eye Near:    Bilateral Near:     Physical Exam Constitutional:      General: She is not in acute distress.    Appearance: Normal appearance. She is not toxic-appearing or diaphoretic.  HENT:     Head: Normocephalic and atraumatic.  Eyes:     Extraocular Movements: Extraocular movements intact.     Conjunctiva/sclera: Conjunctivae normal.  Pulmonary:     Effort: Pulmonary effort is normal.  Musculoskeletal:     Comments: Patient has tenderness to palpation across lower lumbar region.  No crepitus or step-off noted.  No swelling or discoloration noted.  Neurological:     General: No focal deficit present.     Mental Status: She is alert and oriented to person, place, and time. Mental status is at baseline.     Deep Tendon Reflexes: Reflexes are normal and symmetric.  Psychiatric:        Mood and Affect: Mood normal.        Behavior: Behavior normal.        Thought Content: Thought content normal.        Judgment: Judgment normal.      UC Treatments / Results  Labs (all labs ordered are listed, but only abnormal results are displayed) Labs Reviewed - No data to display  EKG   Radiology No results  found.  Procedures Procedures (including critical care time)  Medications Ordered in UC Medications  dexamethasone (DECADRON) injection 10 mg (10 mg Intramuscular Given 08/17/23 1541)    Initial Impression / Assessment and Plan / UC Course  I have reviewed the triage vital signs and the nursing notes.  Pertinent labs & imaging results that were available during my care of the patient were reviewed by me and considered in my medical decision making (see chart for details).     Suspect muscle strain causing symptoms versus sciatica.  Given no injury, imaging was deferred.  IM Decadron administered in urgent care to decrease inflammation and patient prescribed muscle relaxer to take as needed.  Will prescribe low-dose given patient's age and advised her to use this sparingly as it can make her drowsy.  Advised to not drive or drink alcohol wjhile taking it.  Also advised to take separately from amitriptyline to avoid adverse effects.  Patient encouraged to follow-up if any symptoms persist or worsen. Advised to monitor blood glucose closely after steroid today as well. Patient verbalized understanding and was agreeable with plan. Final Clinical Impressions(s) / UC Diagnoses   Final diagnoses:  Acute bilateral low back pain with left-sided sciatica     Discharge Instructions      You were given a steroid shot today in urgent care to decrease inflammation as well as a muscle relaxer to take as needed.  Please be advised that muscle relaxer can make you drowsy so do not drive or drink alcohol with taking it.  Follow-up if symptoms persist or worsen.    ED  Prescriptions     Medication Sig Dispense Auth. Provider   tiZANidine (ZANAFLEX) 2 MG tablet Take 1 tablet (2 mg total) by mouth every 12 (twelve) hours as needed for muscle spasms. 10 tablet Gustavus Bryant, [place]      PDMP not reviewed this encounter.   Gustavus Bryant, [place] 08/17/23 1543    Gustavus Bryant, [place] 08/17/23 1544

## 2023-11-06 ENCOUNTER — Other Ambulatory Visit: Payer: Self-pay | Admitting: Internal Medicine

## 2023-11-06 DIAGNOSIS — Z1231 Encounter for screening mammogram for malignant neoplasm of breast: Secondary | ICD-10-CM

## 2023-12-18 DIAGNOSIS — L729 Follicular cyst of the skin and subcutaneous tissue, unspecified: Secondary | ICD-10-CM | POA: Diagnosis not present

## 2023-12-18 DIAGNOSIS — G44209 Tension-type headache, unspecified, not intractable: Secondary | ICD-10-CM | POA: Diagnosis not present

## 2023-12-18 DIAGNOSIS — E559 Vitamin D deficiency, unspecified: Secondary | ICD-10-CM | POA: Diagnosis not present

## 2024-01-06 DIAGNOSIS — L83 Acanthosis nigricans: Secondary | ICD-10-CM | POA: Diagnosis not present

## 2024-01-06 DIAGNOSIS — L723 Sebaceous cyst: Secondary | ICD-10-CM | POA: Diagnosis not present

## 2024-01-06 DIAGNOSIS — L728 Other follicular cysts of the skin and subcutaneous tissue: Secondary | ICD-10-CM | POA: Diagnosis not present

## 2024-01-08 ENCOUNTER — Ambulatory Visit
Admission: RE | Admit: 2024-01-08 | Discharge: 2024-01-08 | Disposition: A | Discharge status: Home / Self Care | Source: Ambulatory Visit | Attending: Internal Medicine | Admitting: Internal Medicine

## 2024-01-08 DIAGNOSIS — Z1231 Encounter for screening mammogram for malignant neoplasm of breast: Secondary | ICD-10-CM

## 2024-01-30 DIAGNOSIS — E559 Vitamin D deficiency, unspecified: Secondary | ICD-10-CM | POA: Diagnosis not present

## 2024-01-30 DIAGNOSIS — R519 Headache, unspecified: Secondary | ICD-10-CM | POA: Diagnosis not present

## 2024-01-30 DIAGNOSIS — E1142 Type 2 diabetes mellitus with diabetic polyneuropathy: Secondary | ICD-10-CM | POA: Diagnosis not present

## 2024-01-30 DIAGNOSIS — E785 Hyperlipidemia, unspecified: Secondary | ICD-10-CM | POA: Diagnosis not present

## 2024-06-18 ENCOUNTER — Encounter (HOSPITAL_COMMUNITY): Payer: Self-pay

## 2024-06-18 ENCOUNTER — Emergency Department (HOSPITAL_COMMUNITY)
Admission: EM | Admit: 2024-06-18 | Discharge: 2024-06-18 | Disposition: A | Discharge status: Home / Self Care | Attending: Emergency Medicine | Admitting: Emergency Medicine

## 2024-06-18 ENCOUNTER — Other Ambulatory Visit: Payer: Self-pay

## 2024-06-18 DIAGNOSIS — W57XXXA Bitten or stung by nonvenomous insect and other nonvenomous arthropods, initial encounter: Secondary | ICD-10-CM | POA: Insufficient documentation

## 2024-06-18 DIAGNOSIS — S80861A Insect bite (nonvenomous), right lower leg, initial encounter: Secondary | ICD-10-CM | POA: Diagnosis not present

## 2024-06-18 DIAGNOSIS — E119 Type 2 diabetes mellitus without complications: Secondary | ICD-10-CM | POA: Diagnosis not present

## 2024-06-18 DIAGNOSIS — I10 Essential (primary) hypertension: Secondary | ICD-10-CM | POA: Diagnosis not present

## 2024-06-18 DIAGNOSIS — F445 Conversion disorder with seizures or convulsions: Secondary | ICD-10-CM | POA: Diagnosis not present

## 2024-06-18 DIAGNOSIS — T63484A Toxic effect of venom of other arthropod, undetermined, initial encounter: Secondary | ICD-10-CM | POA: Diagnosis not present

## 2024-06-18 DIAGNOSIS — G4489 Other headache syndrome: Secondary | ICD-10-CM | POA: Diagnosis not present

## 2024-06-18 DIAGNOSIS — R404 Transient alteration of awareness: Secondary | ICD-10-CM | POA: Diagnosis not present

## 2024-06-18 DIAGNOSIS — R569 Unspecified convulsions: Secondary | ICD-10-CM | POA: Diagnosis not present

## 2024-06-18 LAB — URINALYSIS, ROUTINE W REFLEX MICROSCOPIC
Bacteria, UA: NONE SEEN
Bilirubin Urine: NEGATIVE
Glucose, UA: NEGATIVE mg/dL
Hgb urine dipstick: NEGATIVE
Ketones, ur: NEGATIVE mg/dL
Nitrite: NEGATIVE
Protein, ur: NEGATIVE mg/dL
Specific Gravity, Urine: 1.012 (ref 1.005–1.030)
pH: 5 (ref 5.0–8.0)

## 2024-06-18 LAB — MAGNESIUM: Magnesium: 1.8 mg/dL (ref 1.7–2.4)

## 2024-06-18 LAB — CBC WITH DIFFERENTIAL/PLATELET
Abs Immature Granulocytes: 0.02 K/uL (ref 0.00–0.07)
Basophils Absolute: 0 K/uL (ref 0.0–0.1)
Basophils Relative: 0 %
Eosinophils Absolute: 0.2 K/uL (ref 0.0–0.5)
Eosinophils Relative: 2 %
HCT: 36.7 % (ref 36.0–46.0)
Hemoglobin: 12.2 g/dL (ref 12.0–15.0)
Immature Granulocytes: 0 %
Lymphocytes Relative: 29 %
Lymphs Abs: 2.5 K/uL (ref 0.7–4.0)
MCH: 27.1 pg (ref 26.0–34.0)
MCHC: 33.2 g/dL (ref 30.0–36.0)
MCV: 81.6 fL (ref 80.0–100.0)
Monocytes Absolute: 0.9 K/uL (ref 0.1–1.0)
Monocytes Relative: 10 %
Neutro Abs: 5 K/uL (ref 1.7–7.7)
Neutrophils Relative %: 59 %
Platelets: 255 K/uL (ref 150–400)
RBC: 4.5 MIL/uL (ref 3.87–5.11)
RDW: 13.8 % (ref 11.5–15.5)
WBC: 8.6 K/uL (ref 4.0–10.5)
nRBC: 0 % (ref 0.0–0.2)

## 2024-06-18 LAB — COMPREHENSIVE METABOLIC PANEL WITH GFR
ALT: 17 U/L (ref 0–44)
AST: 22 U/L (ref 15–41)
Albumin: 3.6 g/dL (ref 3.5–5.0)
Alkaline Phosphatase: 69 U/L (ref 38–126)
Anion gap: 12 (ref 5–15)
BUN: 16 mg/dL (ref 8–23)
CO2: 24 mmol/L (ref 22–32)
Calcium: 10.6 mg/dL — ABNORMAL HIGH (ref 8.9–10.3)
Chloride: 106 mmol/L (ref 98–111)
Creatinine, Ser: 0.71 mg/dL (ref 0.44–1.00)
GFR, Estimated: >60 mL/min (ref >60)
Glucose, Bld: 118 mg/dL — ABNORMAL HIGH (ref 70–99)
Potassium: 3.9 mmol/L (ref 3.5–5.1)
Sodium: 142 mmol/L (ref 135–145)
Total Bilirubin: 0.8 mg/dL (ref 0.0–1.2)
Total Protein: 6.4 g/dL — ABNORMAL LOW (ref 6.5–8.1)

## 2024-06-18 MED ORDER — PROCHLORPERAZINE EDISYLATE 10 MG/2ML IJ SOLN
10.0000 mg | Freq: Once | INTRAMUSCULAR | Status: AC
  Administered 2024-06-18: 10 mg via INTRAVENOUS
  Filled 2024-06-18: qty 2

## 2024-06-18 MED ORDER — LACTATED RINGERS IV BOLUS
1000.0000 mL | Freq: Once | INTRAVENOUS | Status: AC
  Administered 2024-06-18: 1000 mL via INTRAVENOUS

## 2024-06-18 MED ORDER — DIPHENHYDRAMINE HCL 50 MG/ML IJ SOLN
25.0000 mg | Freq: Once | INTRAMUSCULAR | Status: AC
  Administered 2024-06-18: 25 mg via INTRAVENOUS
  Filled 2024-06-18: qty 1

## 2024-06-18 NOTE — ED Provider Notes (Signed)
 [place] EMERGENCY DEPARTMENT AT [facility] Provider Note HPI Heidi Lewis is a 68 y.o. female with a medical history of hypertension, diabetes, chronic daily headaches, psychogenic non epileptic seizures who presents to the emergency department via EMS for seizure-like activity.  EMS reports that the patient was power washing a friend's house today when she got bit by an insect on the right leg.  She is complaining of pain to the right leg.  She then had an episode of general tonic-clonic seizure-like activity that lasted approximately 1 minute.  EMS arrived and she proceeded to have 3 more generalized shaking episodes that lasted approximately 1 minute each.  Minimal postictal period.  She was given 2.5 mg of IV Versed  for the 3rd and 4th.  Patient states that she might have been bitten by an ant and that her right leg was burning.  She is complaining of a headache.  She has a history of stress-induced seizures.  Denies fevers, cough, chest pain, shortness of breath  Past Medical History:  Diagnosis Date   Allergy    Anxiety    Depression    Diabetes mellitus    type 2   Headache(784.0)    Heart murmur    never caused any problems   Hemorrhoid    Hyperlipidemia    Hypertension    dr  FORBES Colon   Neuromuscular disorder [facility])    carpel tunnel left hand   Seizures (HCC)    Last seizure 05/2016. psychogenic non-epileptic events with normal EEG   Vaginitis, atrophic    Past Surgical History:  Procedure Laterality Date   CESAREAN SECTION     x3   COLONOSCOPY  05-07-2005   EVALUATION UNDER ANESTHESIA WITH HEMORRHOIDECTOMY  09/19/2012   Procedure: EXAM UNDER ANESTHESIA WITH HEMORRHOIDECTOMY;  Surgeon: Vicenta DELENA Poli, MD;  Location: [facility];  Service: General;  Laterality: N/A;  exam under anesthesia hemorrhiodectomy 1 -2 columns   FINGER ARTHROSCOPY Right    pinky and 4th finger   HYSTEROSCOPY WITH D & C N/A 09/06/2016   Procedure: DILATATION AND CURETTAGE LELDON MELINDA LEANORA FREDONIA;  Surgeon: Hargis Paradise, MD;  Location: [facility];  Service: Gynecology;  Laterality: N/A;  POSSIBLE MYOSURE   ORIF TIBIA PLATEAU Left 05/06/2014   Procedure: OPEN REDUCTION INTERNAL FIXATION (ORIF) LEFT TIBIAL PLATEAU;  Surgeon: Lonni CINDERELLA Poli, MD;  Location: [facility];  Service: Orthopedics;  Laterality: Left;   TUBAL LIGATION      Review of Systems Pertinent positives and negative findings are listed as part of the History of Present Illness and MDM  Physical Exam Vitals:   06/18/24 2145 06/18/24 2204  BP: 136/89   Pulse: 96   Resp: 19   Temp: 98.3 F (36.8 C)   TempSrc: Oral   SpO2: 97%   Weight:  78.5 kg  Height:  5' 2 (1.575 m)     Constitutional Nursing notes reviewed Vital signs reviewed  HEENT No obvious trauma Pupils round, equal, and reactive to light. PEARL Neck supple  Respiratory Effort normal Breathing well on room air CTAB  CV Normal rate and rhythm   Abdomen Soft, non-tender, non-distended No peritonitis  Skin No evidence of skin breakdown, erythema or excoriations to the right lower extremity  MSK Atraumatic No obvious deformity ROM appropriate No tenderness to palpation over the right lower extremity Full strength in the right lower extremity Palpable pulse in the right lower extremity  Neuro Cranial nerves II through XII intact 5/5 symmetric strength bilateral  upper lower extremities Sensation to light touch intact and equal in bilateral upper and lower extremities Finger-nose intact    MDM:  Initial Differential Diagnoses includes psychogenic nonepileptic seizure, epileptic seizure, headache, migraine, intracranial mass, CVA, insect bite, cellulitis  I reviewed the patient's vitals, the nursing triage note and evaluated the patient at bedside.   I reviewed the patient's external records which show that the patient has a diagnosis of psychogenic nonepileptic seizures with a normal EEG and evaluated by  neurology.  She had an episode of general shaking in front of me.  It appeared volitional.  She responded to normal saline drip into her eyes.  She was able to tell me her name and answer questions during the episode.  Given her history and this witnessed event, I have very low suspicion for true epileptic seizures.  Very low suspicion for intracranial abnormality as the patient is neurologically intact.  Cranial nerves intact.  Full symmetric strength in bilateral upper and lower extremities.  Sensation light touch intact throughout.  Finger-nose intact.  Right lower extremity evaluated for bites, wounds, infection.  No evidence of cellulitis or skin breakdown.  No excoriations.  She has full strength and ankle plantarflexion/dorsiflexion, knee flexion/extension, hip flexion.  Palpable pulse in the right lower extremity.  Will get basic labs, EKG and give a headache cocktail with fluids, Benadryl  and Compazine .  EKG reviewed by myself shows normal sinus rhythm without any evidence of acute ischemic changes.  No high-grade conduction block.  No evidence of new onset arrhythmia.  Labs overall reassuring with no leukocytosis, anemia or significant electrolyte abnormalities that would explain her symptoms.  No AKI or acute liver injury.  UA noninfectious.  Patient reports improvement of her headache.  Stable for discharge home at this time   Procedures: Procedures  Medications administered in the ED: Medications  lactated ringers  bolus 1,000 mL (1,000 mLs Intravenous New Bag/Given 06/18/24 2315)  prochlorperazine  (COMPAZINE ) injection 10 mg (10 mg Intravenous Given 06/18/24 2208)  diphenhydrAMINE  (BENADRYL ) injection 25 mg (25 mg Intravenous Given 06/18/24 2208)     Impression: 1. Psychogenic nonepileptic seizure   2. Insect bite of right lower leg, initial encounter      Patient's presentation is most consistent with acute presentation with potential threat to life or bodily  function.  Disposition: ED Disposition:  Discharge   Discharge: Patient is felt to be medically appropriate for discharge at this time. Patient was instructed to follow up with their primary care doctor/specialists listed above for re-evaluation. Patient was given strict return precautions.  ED Discharge Orders     None             Dionisio Blunt, MD 06/18/24 2333    Towana Ozell BROCKS, MD 06/19/24 [phone]

## 2024-06-18 NOTE — Discharge Instructions (Signed)
 You were seen in the emergency department due to concerns for an insect bite and seizure-like activity.  While you were here we did labs, physical exam and EKG which were all reassuring.  We also treated your headache.  Your workup showed no evidence of life-threatening cause of your seizure activity.  You had no evidence of infection, stroke or electrolyte abnormality.  Please come back to the emergency department if you develop fevers, neck stiffness, weakness on one side of the body, or if you have any other reason to believe you need emergency care.

## 2024-06-18 NOTE — ED Triage Notes (Signed)
 Patient BIB [facility] from home for seizure like activity following insect bite. Patient had insect bites, unsure what kind, at 1800, went home, washed site with alcohol, then came headache followed by seizure like activity lasting 1-2 mins, 2 more episodes witnessed by EMS en route also lasting 2 mins, and one episode on arrival to ED. Patient given 2 2.5mg  midazolam  doses about 20 mins apart. Patient reports burning to site with bug bites and headache that has not resolved with 1g of tylenol . Patient now A&Ox4 BP 125/70 HR 82 98% RA CBG 160 RR 15 20 L forearm.

## 2024-06-24 DIAGNOSIS — I10 Essential (primary) hypertension: Secondary | ICD-10-CM | POA: Diagnosis not present

## 2024-06-24 DIAGNOSIS — E1169 Type 2 diabetes mellitus with other specified complication: Secondary | ICD-10-CM | POA: Diagnosis not present
